# Patient Record
Sex: Female | Born: 1960
Health system: Southern US, Community
[De-identification: ages and names within clinical notes are randomized; demographics above are authoritative.]

## PROBLEM LIST (undated history)

## (undated) DIAGNOSIS — M25519 Pain in unspecified shoulder: Secondary | ICD-10-CM

## (undated) DIAGNOSIS — J019 Acute sinusitis, unspecified: Secondary | ICD-10-CM

## (undated) DIAGNOSIS — M549 Dorsalgia, unspecified: Secondary | ICD-10-CM

## (undated) DIAGNOSIS — J309 Allergic rhinitis, unspecified: Secondary | ICD-10-CM

## (undated) DIAGNOSIS — K219 Gastro-esophageal reflux disease without esophagitis: Secondary | ICD-10-CM

## (undated) DIAGNOSIS — Z5189 Encounter for other specified aftercare: Secondary | ICD-10-CM

## (undated) DIAGNOSIS — M545 Low back pain: Secondary | ICD-10-CM

## (undated) DIAGNOSIS — R42 Dizziness and giddiness: Secondary | ICD-10-CM

## (undated) DIAGNOSIS — R300 Dysuria: Secondary | ICD-10-CM

## (undated) DIAGNOSIS — J302 Other seasonal allergic rhinitis: Secondary | ICD-10-CM

## (undated) DIAGNOSIS — M1990 Unspecified osteoarthritis, unspecified site: Secondary | ICD-10-CM

## (undated) DIAGNOSIS — IMO0002 Reserved for concepts with insufficient information to code with codable children: Secondary | ICD-10-CM

## (undated) DIAGNOSIS — M25539 Pain in unspecified wrist: Secondary | ICD-10-CM

## (undated) DIAGNOSIS — T7840XA Allergy, unspecified, initial encounter: Secondary | ICD-10-CM

## (undated) DIAGNOSIS — M25569 Pain in unspecified knee: Secondary | ICD-10-CM

## (undated) HISTORY — DX: Pain in unspecified shoulder: M25.519

## (undated) HISTORY — DX: Pain in unspecified knee: M25.569

## (undated) HISTORY — DX: Dizziness and giddiness: R42

## (undated) HISTORY — DX: Acute sinusitis, unspecified: J01.90

## (undated) HISTORY — DX: Pain in unspecified wrist: M25.539

## (undated) HISTORY — DX: Encounter for other specified aftercare: Z51.89

## (undated) HISTORY — DX: Allergy, unspecified, initial encounter: T78.40XA

## (undated) HISTORY — DX: Allergic rhinitis, unspecified: J30.9

## (undated) HISTORY — DX: Dysuria: R30.0

## (undated) HISTORY — DX: Gastro-esophageal reflux disease without esophagitis: K21.9

## (undated) HISTORY — DX: Unspecified osteoarthritis, unspecified site: M19.90

## (undated) HISTORY — DX: Low back pain: M54.5

## (undated) HISTORY — DX: Reserved for concepts with insufficient information to code with codable children: IMO0002

## (undated) HISTORY — DX: Dorsalgia, unspecified: M54.9

---

## 1985-12-10 HISTORY — PX: TONSILLECTOMY: SHX5217

## 1998-05-24 ENCOUNTER — Other Ambulatory Visit: Admission: RE | Admit: 1998-05-24 | Discharge: 1998-05-24 | Payer: Self-pay | Admitting: Gynecology

## 1998-07-30 ENCOUNTER — Emergency Department (HOSPITAL_COMMUNITY): Admission: EM | Admit: 1998-07-30 | Discharge: 1998-07-30 | Payer: Self-pay | Admitting: Emergency Medicine

## 1998-07-31 ENCOUNTER — Emergency Department (HOSPITAL_COMMUNITY): Admission: EM | Admit: 1998-07-31 | Discharge: 1998-07-31 | Payer: Self-pay | Admitting: Emergency Medicine

## 2000-06-13 ENCOUNTER — Other Ambulatory Visit: Admission: RE | Admit: 2000-06-13 | Discharge: 2000-06-13 | Payer: Self-pay | Admitting: Family Medicine

## 2000-10-27 ENCOUNTER — Inpatient Hospital Stay (HOSPITAL_COMMUNITY): Admission: RE | Admit: 2000-10-27 | Discharge: 2000-10-27 | Payer: Self-pay | Admitting: Gynecology

## 2000-10-27 ENCOUNTER — Encounter: Payer: Self-pay | Admitting: Gynecology

## 2004-10-20 ENCOUNTER — Emergency Department (HOSPITAL_COMMUNITY): Admission: EM | Admit: 2004-10-20 | Discharge: 2004-10-20 | Payer: Self-pay | Admitting: Emergency Medicine

## 2004-12-10 HISTORY — PX: DILATION AND CURETTAGE OF UTERUS: SHX78

## 2004-12-15 ENCOUNTER — Ambulatory Visit (HOSPITAL_COMMUNITY): Admission: RE | Admit: 2004-12-15 | Discharge: 2004-12-15 | Payer: Self-pay | Admitting: Obstetrics and Gynecology

## 2004-12-21 ENCOUNTER — Inpatient Hospital Stay (HOSPITAL_COMMUNITY): Admission: AD | Admit: 2004-12-21 | Discharge: 2005-01-10 | Payer: Self-pay | Admitting: Obstetrics and Gynecology

## 2005-01-03 ENCOUNTER — Ambulatory Visit: Payer: Self-pay | Admitting: Neonatology

## 2005-01-08 ENCOUNTER — Encounter (INDEPENDENT_AMBULATORY_CARE_PROVIDER_SITE_OTHER): Payer: Self-pay | Admitting: *Deleted

## 2005-03-05 ENCOUNTER — Other Ambulatory Visit: Admission: RE | Admit: 2005-03-05 | Discharge: 2005-03-05 | Payer: Self-pay | Admitting: Obstetrics and Gynecology

## 2005-03-06 ENCOUNTER — Ambulatory Visit (HOSPITAL_COMMUNITY): Admission: RE | Admit: 2005-03-06 | Discharge: 2005-03-06 | Payer: Self-pay | Admitting: Obstetrics and Gynecology

## 2005-05-10 HISTORY — PX: ABDOMINAL HYSTERECTOMY: SHX81

## 2005-05-22 ENCOUNTER — Inpatient Hospital Stay (HOSPITAL_COMMUNITY): Admission: RE | Admit: 2005-05-22 | Discharge: 2005-05-24 | Payer: Self-pay | Admitting: Obstetrics and Gynecology

## 2005-05-22 ENCOUNTER — Encounter (INDEPENDENT_AMBULATORY_CARE_PROVIDER_SITE_OTHER): Payer: Self-pay | Admitting: *Deleted

## 2005-10-13 IMAGING — US US OB LIMITED
1 series · 14 of 28 positions shown · non-contrast
Comparison: none

CLINICAL DATA: 20 week 0 day assigned gestational age.  Premature rupture of membranes.  Evaluate amniotic fluid.

[Series 1: us ob limited · 0.22mm/px · 14 of 42 slices shown]
[im 2/42]
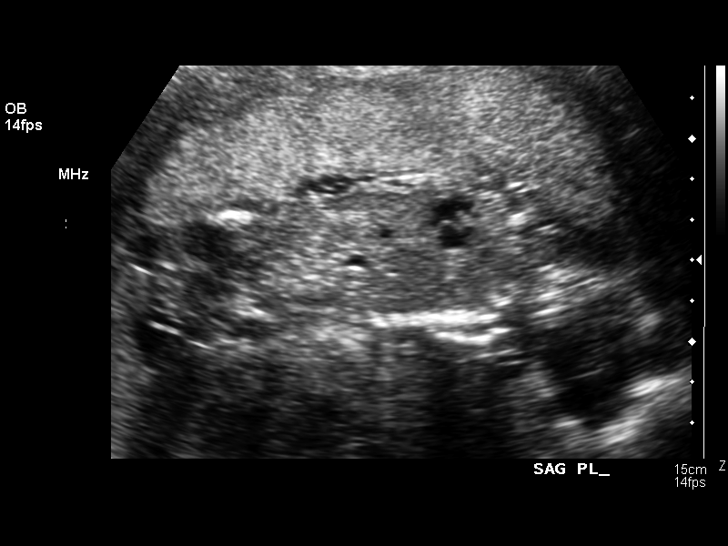
[im 5/42]
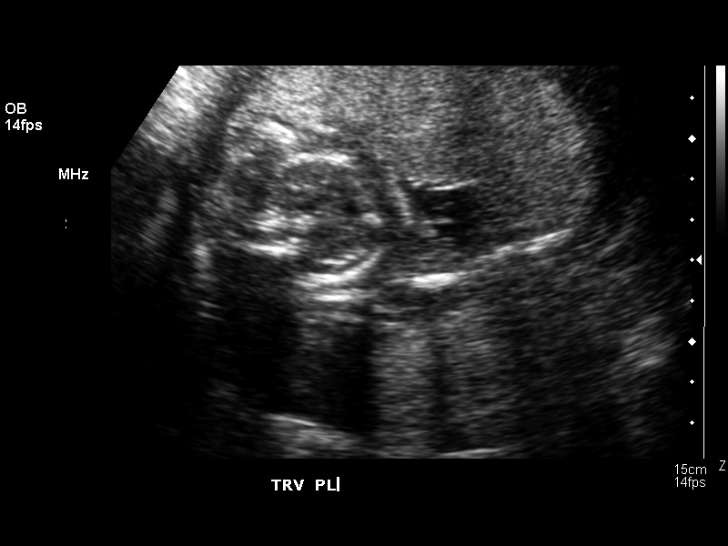
[im 8/42]
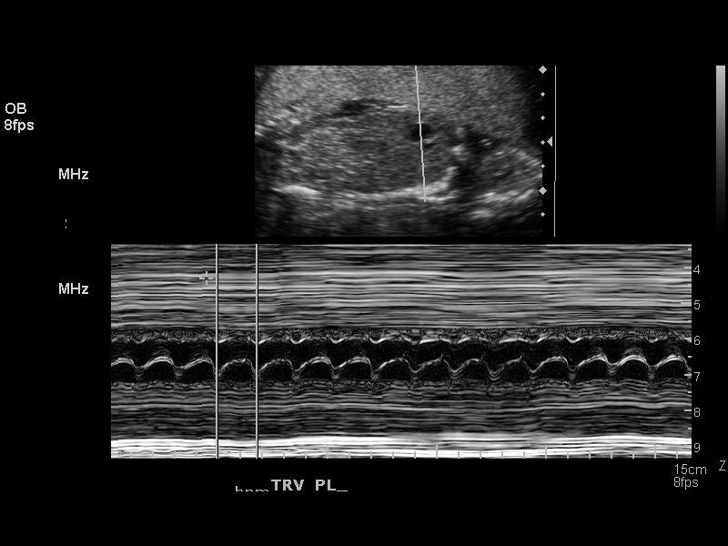
[im 11/42]
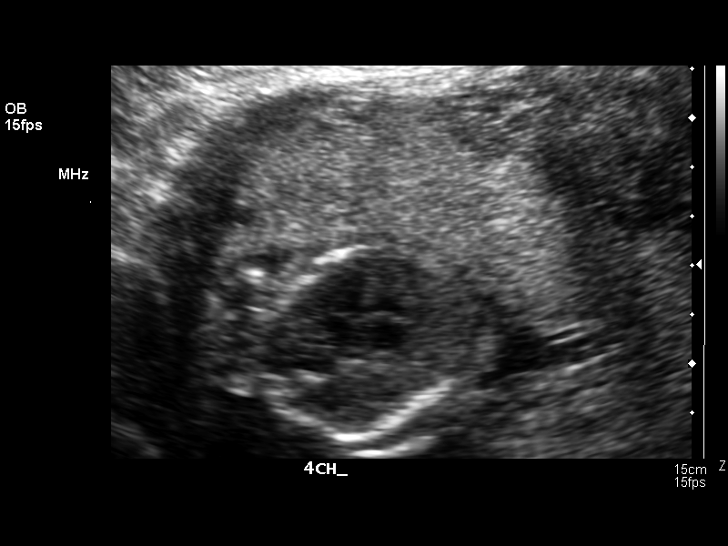
[im 14/42]
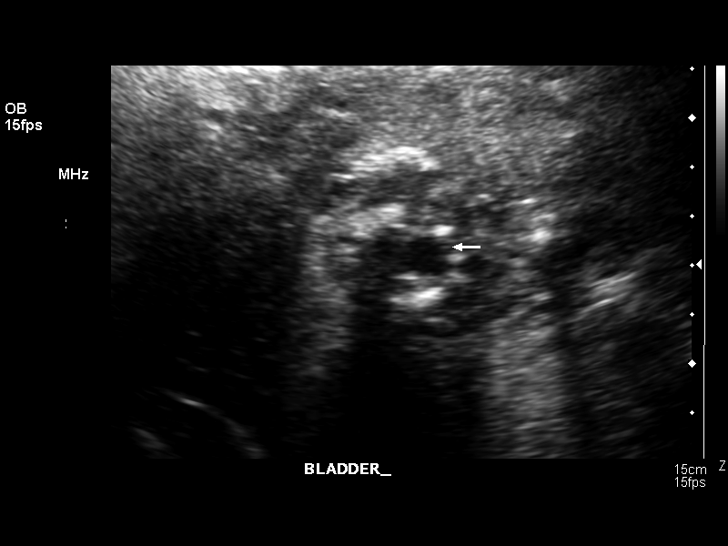
[im 17/42]
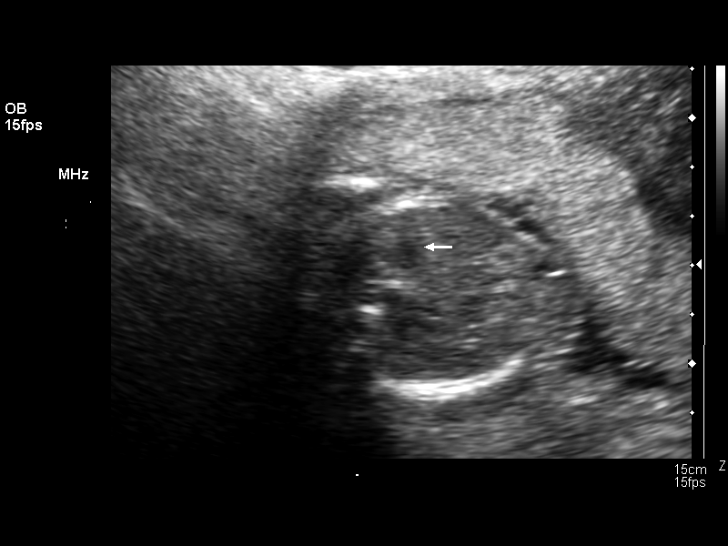
[im 20/42]
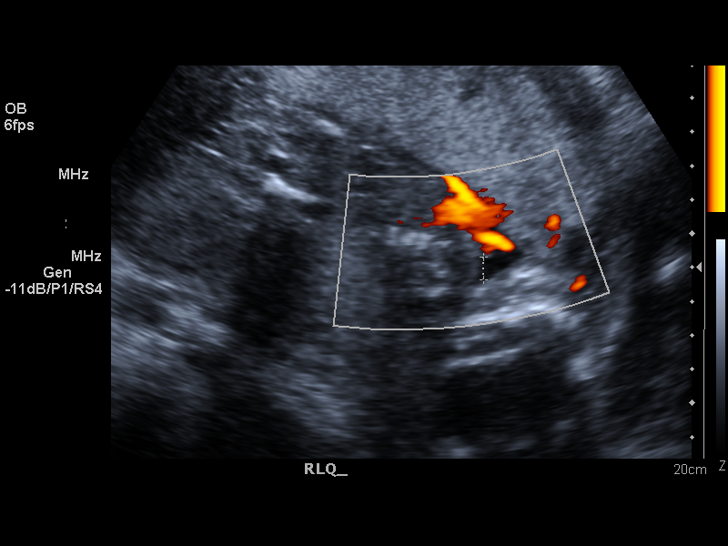
[im 23/42]
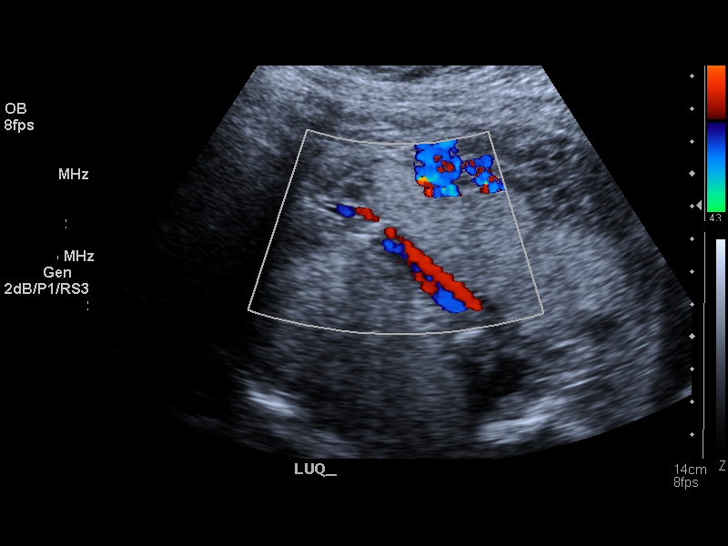
[im 26/42]
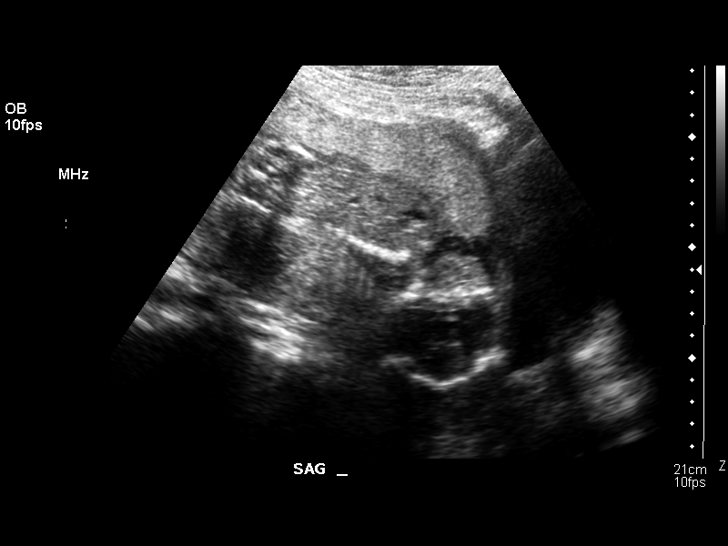
[im 29/42]
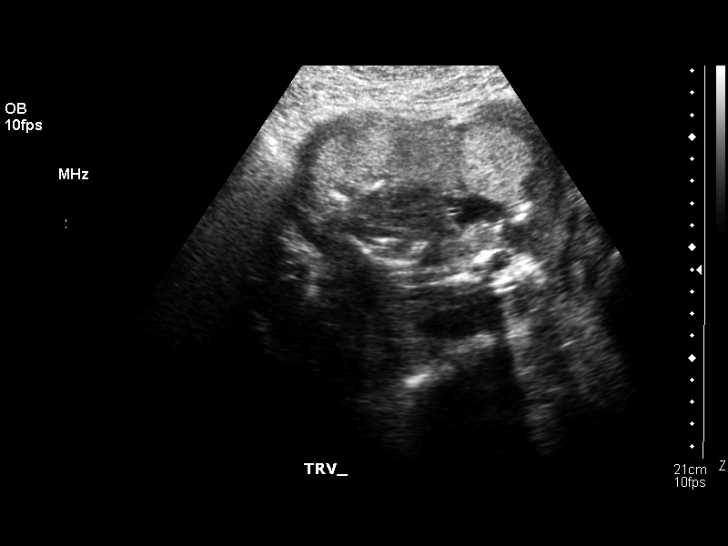
[im 32/42]
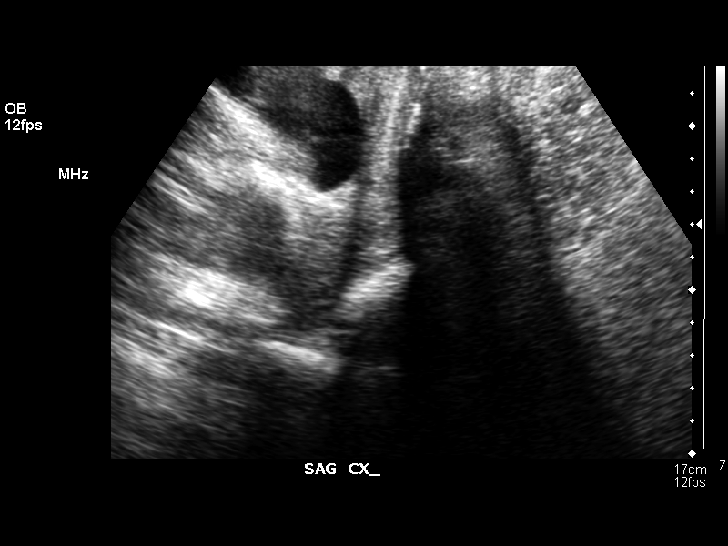
[im 35/42]
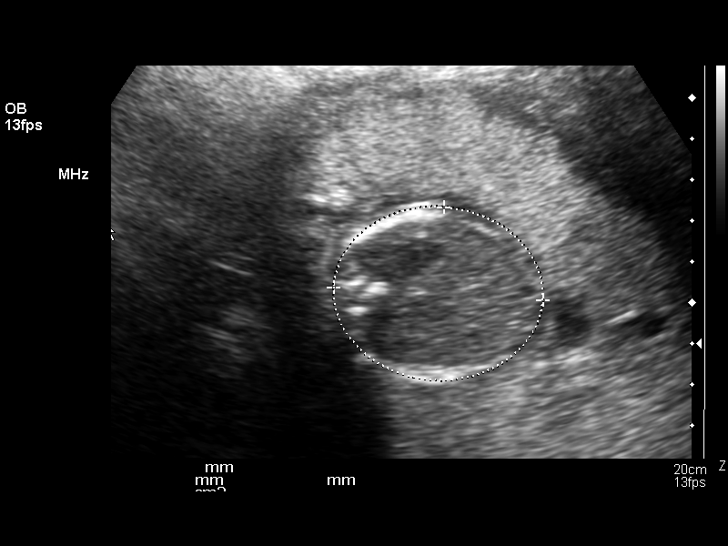
[im 38/42]
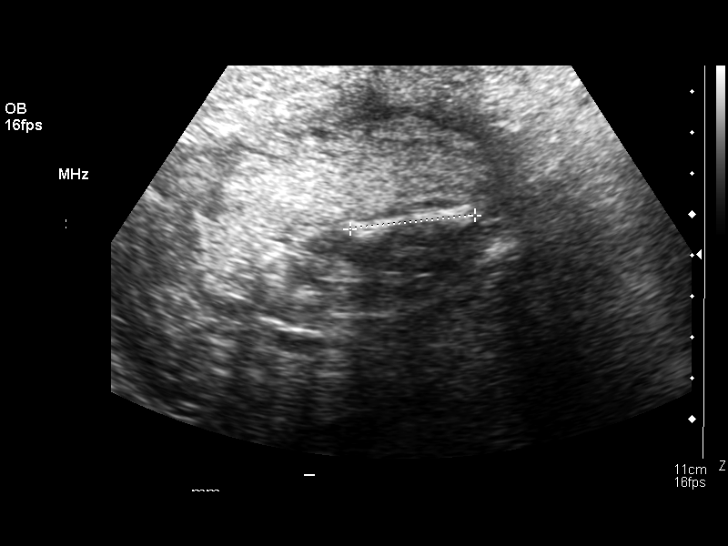
[im 42/42]
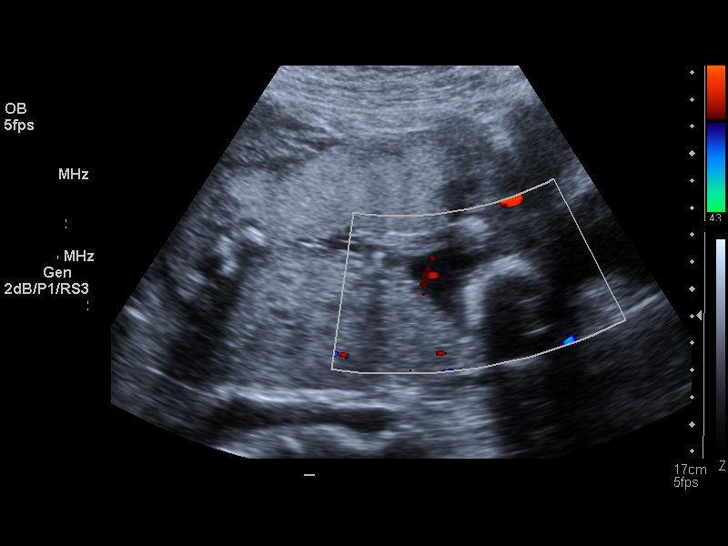

[14 of 28 positions shown; findings below may reference images not displayed]

LIMITED OBSTETRICAL ULTRASOUND:
 Number of Fetuses: 1
 Heart Rate:  163
 Movement:  Yes
 Breathing:  No
 Presentation:  Cephalic
 Placental Location:  Anterior
 Grade:  I
 Previa:  No
 Amniotic Fluid (Subjective):  Decreased  
 Amniotic Fluid (Objective):  1.9 cm Vertical pocket 

 Fetal measurements and complete anatomic evaluation were not requested.  The following fetal anatomy was visualized during this exam:  Four chamber heart, stomach, kidneys and bladder.

 MATERNAL UTERINE AND ADNEXAL FINDINGS
 Cervix:  3.3 cm Translabially
IMPRESSION: Single living intrauterine fetus in cephalic presentation.  Decreased amniotic fluid volume, consistent with patient?s history of ruptured membranes.

## 2006-04-10 ENCOUNTER — Encounter: Payer: Self-pay | Admitting: Emergency Medicine

## 2006-08-30 ENCOUNTER — Other Ambulatory Visit: Admission: RE | Admit: 2006-08-30 | Discharge: 2006-08-30 | Payer: Self-pay | Admitting: Obstetrics and Gynecology

## 2006-09-05 ENCOUNTER — Ambulatory Visit (HOSPITAL_COMMUNITY): Admission: RE | Admit: 2006-09-05 | Discharge: 2006-09-05 | Payer: Self-pay | Admitting: Obstetrics and Gynecology

## 2007-08-11 LAB — HM MAMMOGRAPHY: HM Mammogram: NORMAL

## 2008-10-18 ENCOUNTER — Ambulatory Visit: Payer: Self-pay | Admitting: Internal Medicine

## 2008-10-18 DIAGNOSIS — M25539 Pain in unspecified wrist: Secondary | ICD-10-CM | POA: Insufficient documentation

## 2008-10-18 DIAGNOSIS — M545 Low back pain, unspecified: Secondary | ICD-10-CM | POA: Insufficient documentation

## 2008-10-18 DIAGNOSIS — J309 Allergic rhinitis, unspecified: Secondary | ICD-10-CM | POA: Insufficient documentation

## 2008-10-18 DIAGNOSIS — M549 Dorsalgia, unspecified: Secondary | ICD-10-CM

## 2008-10-18 DIAGNOSIS — IMO0002 Reserved for concepts with insufficient information to code with codable children: Secondary | ICD-10-CM | POA: Insufficient documentation

## 2008-10-18 DIAGNOSIS — M25569 Pain in unspecified knee: Secondary | ICD-10-CM | POA: Insufficient documentation

## 2008-10-18 HISTORY — DX: Allergic rhinitis, unspecified: J30.9

## 2008-10-18 HISTORY — DX: Reserved for concepts with insufficient information to code with codable children: IMO0002

## 2008-10-18 HISTORY — DX: Pain in unspecified wrist: M25.539

## 2008-10-18 HISTORY — DX: Pain in unspecified knee: M25.569

## 2008-10-18 HISTORY — DX: Low back pain, unspecified: M54.50

## 2008-10-18 HISTORY — DX: Dorsalgia, unspecified: M54.9

## 2009-06-28 ENCOUNTER — Ambulatory Visit: Payer: Self-pay | Admitting: Internal Medicine

## 2009-06-28 DIAGNOSIS — IMO0002 Reserved for concepts with insufficient information to code with codable children: Secondary | ICD-10-CM

## 2009-06-28 HISTORY — DX: Reserved for concepts with insufficient information to code with codable children: IMO0002

## 2009-07-03 ENCOUNTER — Encounter: Admission: RE | Admit: 2009-07-03 | Discharge: 2009-07-03 | Payer: Self-pay | Admitting: Internal Medicine

## 2009-07-11 ENCOUNTER — Encounter: Payer: Self-pay | Admitting: Internal Medicine

## 2009-07-21 ENCOUNTER — Ambulatory Visit: Payer: Self-pay | Admitting: Internal Medicine

## 2009-08-03 ENCOUNTER — Telehealth: Payer: Self-pay | Admitting: Internal Medicine

## 2009-08-04 ENCOUNTER — Ambulatory Visit: Payer: Self-pay | Admitting: Internal Medicine

## 2009-08-04 HISTORY — PX: COLONOSCOPY: SHX174

## 2009-08-04 LAB — HM COLONOSCOPY

## 2009-10-30 ENCOUNTER — Emergency Department (HOSPITAL_COMMUNITY): Admission: EM | Admit: 2009-10-30 | Discharge: 2009-10-31 | Payer: Self-pay | Admitting: Emergency Medicine

## 2009-11-01 ENCOUNTER — Ambulatory Visit: Payer: Self-pay | Admitting: Internal Medicine

## 2009-11-01 DIAGNOSIS — R42 Dizziness and giddiness: Secondary | ICD-10-CM | POA: Insufficient documentation

## 2009-11-01 HISTORY — DX: Dizziness and giddiness: R42

## 2009-11-01 LAB — CONVERTED CEMR LAB
Cholesterol: 152 mg/dL (ref 0–200)
HDL: 58.6 mg/dL (ref >39.00)
LDL Cholesterol: 78 mg/dL (ref 0–99)
TSH: 0.9 microintl units/mL (ref 0.35–5.50)
Total CHOL/HDL Ratio: 3
Triglycerides: 76 mg/dL (ref 0.0–149.0)
VLDL: 15.2 mg/dL (ref 0.0–40.0)

## 2009-12-21 ENCOUNTER — Telehealth: Payer: Self-pay | Admitting: Internal Medicine

## 2009-12-22 ENCOUNTER — Ambulatory Visit: Payer: Self-pay | Admitting: Internal Medicine

## 2009-12-22 DIAGNOSIS — J019 Acute sinusitis, unspecified: Secondary | ICD-10-CM

## 2009-12-22 HISTORY — DX: Acute sinusitis, unspecified: J01.90

## 2009-12-24 LAB — CONVERTED CEMR LAB
HCV Ab: NEGATIVE
Hep A IgM: NEGATIVE
Hep B C IgM: NEGATIVE
Hepatitis B Surface Ag: NEGATIVE

## 2010-01-06 ENCOUNTER — Telehealth: Payer: Self-pay | Admitting: Internal Medicine

## 2010-01-09 ENCOUNTER — Telehealth: Payer: Self-pay | Admitting: Internal Medicine

## 2010-06-28 ENCOUNTER — Ambulatory Visit: Payer: Self-pay | Admitting: Internal Medicine

## 2010-10-13 ENCOUNTER — Ambulatory Visit: Payer: Self-pay | Admitting: Internal Medicine

## 2010-10-13 LAB — CONVERTED CEMR LAB
ALT: 14 units/L (ref 0–35)
AST: 18 units/L (ref 0–37)
Albumin: 3.8 g/dL (ref 3.5–5.2)
Alkaline Phosphatase: 75 units/L (ref 39–117)
BUN: 9 mg/dL (ref 6–23)
Basophils Absolute: 0 10*3/uL (ref 0.0–0.1)
Basophils Relative: 0.4 % (ref 0.0–3.0)
Bilirubin Urine: NEGATIVE
Bilirubin, Direct: 0.1 mg/dL (ref 0.0–0.3)
CO2: 29 meq/L (ref 19–32)
Calcium: 9.2 mg/dL (ref 8.4–10.5)
Chloride: 106 meq/L (ref 96–112)
Cholesterol: 147 mg/dL (ref 0–200)
Creatinine, Ser: 0.8 mg/dL (ref 0.4–1.2)
Eosinophils Absolute: 0.1 10*3/uL (ref 0.0–0.7)
Eosinophils Relative: 2.6 % (ref 0.0–5.0)
GFR calc non Af Amer: 98.86 mL/min (ref >60)
Glucose, Bld: 94 mg/dL (ref 70–99)
HCT: 35.8 % — ABNORMAL LOW (ref 36.0–46.0)
HDL: 51.1 mg/dL (ref >39.00)
Hemoglobin: 12.2 g/dL (ref 12.0–15.0)
Ketones, ur: NEGATIVE mg/dL
LDL Cholesterol: 88 mg/dL (ref 0–99)
Leukocytes, UA: NEGATIVE
Lymphocytes Relative: 28.4 % (ref 12.0–46.0)
Lymphs Abs: 1.6 10*3/uL (ref 0.7–4.0)
MCHC: 34.1 g/dL (ref 30.0–36.0)
MCV: 92 fL (ref 78.0–100.0)
Monocytes Absolute: 0.4 10*3/uL (ref 0.1–1.0)
Monocytes Relative: 7.8 % (ref 3.0–12.0)
Neutro Abs: 3.4 10*3/uL (ref 1.4–7.7)
Neutrophils Relative %: 60.8 % (ref 43.0–77.0)
Nitrite: NEGATIVE
Platelets: 243 10*3/uL (ref 150.0–400.0)
Potassium: 4.3 meq/L (ref 3.5–5.1)
RBC: 3.89 M/uL (ref 3.87–5.11)
RDW: 13.2 % (ref 11.5–14.6)
Sodium: 140 meq/L (ref 135–145)
Specific Gravity, Urine: 1.015 (ref 1.000–1.030)
TSH: 1.79 microintl units/mL (ref 0.35–5.50)
Total Bilirubin: 0.4 mg/dL (ref 0.3–1.2)
Total CHOL/HDL Ratio: 3
Total Protein, Urine: NEGATIVE mg/dL
Total Protein: 7 g/dL (ref 6.0–8.3)
Triglycerides: 38 mg/dL (ref 0.0–149.0)
Urine Glucose: NEGATIVE mg/dL
Urobilinogen, UA: 0.2 (ref 0.0–1.0)
VLDL: 7.6 mg/dL (ref 0.0–40.0)
WBC: 5.6 10*3/uL (ref 4.5–10.5)
pH: 6.5 (ref 5.0–8.0)

## 2010-10-18 ENCOUNTER — Ambulatory Visit: Payer: Self-pay | Admitting: Internal Medicine

## 2010-10-18 ENCOUNTER — Encounter: Payer: Self-pay | Admitting: Internal Medicine

## 2010-10-18 DIAGNOSIS — M25519 Pain in unspecified shoulder: Secondary | ICD-10-CM

## 2010-10-18 DIAGNOSIS — R300 Dysuria: Secondary | ICD-10-CM | POA: Insufficient documentation

## 2010-10-18 HISTORY — DX: Pain in unspecified shoulder: M25.519

## 2010-10-18 HISTORY — DX: Dysuria: R30.0

## 2010-10-23 ENCOUNTER — Encounter: Payer: Self-pay | Admitting: Internal Medicine

## 2010-11-10 ENCOUNTER — Telehealth: Payer: Self-pay | Admitting: Internal Medicine

## 2010-12-27 ENCOUNTER — Telehealth: Payer: Self-pay | Admitting: Internal Medicine

## 2010-12-28 ENCOUNTER — Ambulatory Visit
Admission: RE | Admit: 2010-12-28 | Discharge: 2010-12-28 | Payer: Self-pay | Source: Home / Self Care | Attending: Internal Medicine | Admitting: Internal Medicine

## 2010-12-30 ENCOUNTER — Encounter: Payer: Self-pay | Admitting: Obstetrics and Gynecology

## 2011-01-04 ENCOUNTER — Other Ambulatory Visit: Payer: Self-pay | Admitting: Internal Medicine

## 2011-01-04 DIAGNOSIS — J329 Chronic sinusitis, unspecified: Secondary | ICD-10-CM

## 2011-01-07 LAB — CONVERTED CEMR LAB
ALT: 18 units/L (ref 0–35)
AST: 18 units/L (ref 0–37)
Albumin: 3.9 g/dL (ref 3.5–5.2)
Alkaline Phosphatase: 63 units/L (ref 39–117)
BUN: 9 mg/dL (ref 6–23)
Basophils Absolute: 0 10*3/uL (ref 0.0–0.1)
Basophils Relative: 1 % (ref 0.0–3.0)
Bilirubin Urine: NEGATIVE
Bilirubin, Direct: 0.1 mg/dL (ref 0.0–0.3)
CA 125: 4.5 units/mL (ref 0.0–30.2)
CO2: 32 meq/L (ref 19–32)
Calcium: 9.4 mg/dL (ref 8.4–10.5)
Chloride: 104 meq/L (ref 96–112)
Cholesterol: 145 mg/dL (ref 0–200)
Creatinine, Ser: 0.7 mg/dL (ref 0.4–1.2)
Eosinophils Absolute: 0.1 10*3/uL (ref 0.0–0.7)
Eosinophils Relative: 2.6 % (ref 0.0–5.0)
GFR calc Af Amer: 115 mL/min
GFR calc non Af Amer: 95 mL/min
Glucose, Bld: 99 mg/dL (ref 70–99)
HCT: 37 % (ref 36.0–46.0)
HDL: 51.7 mg/dL (ref >39.0)
Hemoglobin, Urine: NEGATIVE
Hemoglobin: 12.3 g/dL (ref 12.0–15.0)
Ketones, ur: NEGATIVE mg/dL
LDL Cholesterol: 82 mg/dL (ref 0–99)
Leukocytes, UA: NEGATIVE
Lymphocytes Relative: 31.7 % (ref 12.0–46.0)
MCHC: 33.4 g/dL (ref 30.0–36.0)
MCV: 91.8 fL (ref 78.0–100.0)
Monocytes Absolute: 0.4 10*3/uL (ref 0.1–1.0)
Monocytes Relative: 8 % (ref 3.0–12.0)
Neutro Abs: 2.6 10*3/uL (ref 1.4–7.7)
Neutrophils Relative %: 56.7 % (ref 43.0–77.0)
Nitrite: NEGATIVE
Pap Smear: NORMAL
Platelets: 263 10*3/uL (ref 150–400)
Potassium: 4.3 meq/L (ref 3.5–5.1)
RBC: 4.02 M/uL (ref 3.87–5.11)
RDW: 12.4 % (ref 11.5–14.6)
Sodium: 142 meq/L (ref 135–145)
Specific Gravity, Urine: 1.01 (ref 1.000–1.03)
TSH: 1.09 microintl units/mL (ref 0.35–5.50)
Total Bilirubin: 0.7 mg/dL (ref 0.3–1.2)
Total CHOL/HDL Ratio: 2.8
Total Protein, Urine: NEGATIVE mg/dL
Total Protein: 7.4 g/dL (ref 6.0–8.3)
Triglycerides: 57 mg/dL (ref 0–149)
Urine Glucose: NEGATIVE mg/dL
Urobilinogen, UA: 0.2 (ref 0.0–1.0)
VLDL: 11 mg/dL (ref 0–40)
WBC: 4.5 10*3/uL (ref 4.5–10.5)
pH: 7 (ref 5.0–8.0)

## 2011-01-09 NOTE — Consult Note (Signed)
Summary: [facility]  [facility]   Imported By: Lester [place] 10/27/2010 11:03:31  _____________________________________________________________________  External Attachment:    Type:   Image     Comment:   External Document
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
## 2011-01-09 NOTE — Progress Notes (Signed)
  Phone Note Refill Request  on January 06, 2010 10:49 AM  Refills Requested: Medication #1:  MECLIZINE HCL 12.5 MG TABS 1 - 2 by mouth q 6 hrs as needed dizzy   Dosage confirmed as above?Dosage Confirmed   Notes: [facility] Initial call taken by: Scharlene Gloss,  January 06, 2010 10:49 AM    Prescriptions: FLUTICASONE PROPIONATE 50 MCG/ACT SUSP (FLUTICASONE PROPIONATE) 1 spray/side once daily  #1 x 11   Entered by:   Scharlene Gloss   Authorized by:   Corwin Levins MD   Signed by:   Scharlene Gloss on 01/06/2010   Method used:   Faxed to ...       [facility] Delivery Fax) (mail-order)             ,          Ph: [phone]       Fax: [phone]   RxID:   [id number]

## 2011-01-09 NOTE — Progress Notes (Signed)
Summary: RX req  Phone Note Call from Patient   Caller: Patient [phone] Summary of Call: Pt called stating she recently completed a ABX course and now has a yeast infection. Pt is requesting Rx to [facility] to [facility] Initial call taken by: Margaret Pyle, CMA,  November 10, 2010 1:10 PM  Follow-up for Phone Call        done per emr Follow-up by: Corwin Levins MD,  November 10, 2010 2:06 PM  Additional Follow-up for Phone Call Additional follow up Details #1::        Pt advised via VM Additional Follow-up by: Margaret Pyle, CMA,  November 10, 2010 2:38 PM    New/Updated Medications: FLUCONAZOLE 150 MG TABS (FLUCONAZOLE) 1po q 3 days as needed Prescriptions: FLUCONAZOLE 150 MG TABS (FLUCONAZOLE) 1po q 3 days as needed  #2 x 1   Entered and Authorized by:   Corwin Levins MD   Signed by:   Corwin Levins MD on 11/10/2010   Method used:   [facility]ronically to        [facility] # [phone]* (retail)       [address] [place], [place]  [address]       Ph: [phone]       Fax: [phone]   RxID:   [id number]

## 2011-01-09 NOTE — Letter (Signed)
Summary: [facility]   Imported By: Sherian Rein 07/21/2009 13:30:59  _____________________________________________________________________  External Attachment:    Type:   Image     Comment:   External Document

## 2011-01-09 NOTE — Progress Notes (Signed)
Summary: Yeast  Phone Note Call from Patient   Caller: Patient [phone] Summary of Call: pt called stating that she has a yeast inf from ABX for sinus inf. pt is requesting Rx Initial call taken by: Margaret Pyle, CMA,  January 09, 2010 10:45 AM  Follow-up for Phone Call        done escript Follow-up by: Corwin Levins MD,  January 09, 2010 12:51 PM  Additional Follow-up for Phone Call Additional follow up Details #1::        pt informed via VM Additional Follow-up by: Margaret Pyle, CMA,  January 09, 2010 1:15 PM    New/Updated Medications: FLUCONAZOLE 150 MG TABS (FLUCONAZOLE) 1 by mouth every other day as needed Prescriptions: FLUCONAZOLE 150 MG TABS (FLUCONAZOLE) 1 by mouth every other day as needed  #2 x 1   Entered and Authorized by:   Corwin Levins MD   Signed by:   Corwin Levins MD on 01/09/2010   Method used:   Electronically to        [facility] # [phone]* (retail)       [address] [place], [place]  [address]       Ph: [phone]       Fax: [phone]   RxID:   [id number]

## 2011-01-09 NOTE — Progress Notes (Signed)
Summary: ? re procedure  Phone Note Call from Patient Call back at Home Phone [phone]   Caller: Patient Call For: perry Reason for Call: Talk to Nurse Summary of Call: Patient wants to go over instructions with nurse, having procedure tomorrow Initial call taken by: Tawni Levy,  August 03, 2009 8:45 AM  Follow-up for Phone Call        Pt having colonoscopy tomorrow, using Moviprep.  Pt wanted to review instructions; instructed, per sheet as to when to drink prep today and when to start tomorrow.  Pt voiced understanding and has no further questions Follow-up by: Karl Bales RN,  August 03, 2009 8:57 AM

## 2011-01-09 NOTE — Assessment & Plan Note (Signed)
Summary: SINUS/NWS   Vital Signs:  Patient profile:   50 year old female Height:      64 inches Weight:      226 pounds BMI:     38.93 O2 Sat:      97 % on Room air Temp:     98.5 degrees F oral Pulse rate:   85 / minute BP sitting:   116 / 72  (left arm) Cuff size:   regular  Vitals Entered ByZella Ball Ewing (December 22, 2009 10:19 AM)  O2 Flow:  Room air CC: sinus congestion/RE   CC:  sinus congestion/RE.  History of Present Illness: here with 3 days onset facial pain, pressure, fever and greenish. bloody d/c without ST , and Pt denies CP, sob, doe, wheezing, orthopnea, pnd, worsening LE edema, palps, dizziness or syncope   Also mentions her husband has recetnly been diagnosed with Hep C and she would like to be tested.  Pt denies new neuro symptoms such as headache, facial or extremity weakness   No GI symptoms as well or chronic liver s/s.    Problems Prior to Update: 1)  Sinusitis- Acute-nos  (ICD-461.9) 2)  Contact or Exposure To Other Viral Diseases  (ICD-V01.79) 3)  Intermittent Vertigo  (ICD-780.4) 4)  Preventive Health Care  (ICD-V70.0) 5)  Lumbar Radiculopathy, Right  (ICD-724.4) 6)  Neoplasm, Malignant, Colon, Family Hx, Father  (ICD-V16.0) 7)  Back Pain, Upper  (ICD-724.5) 8)  Unspecified Neuralgia Neuritis and Radiculitis  (ICD-729.2) 9)  Low Back Pain  (ICD-724.2) 10)  Knee Pain, Right  (ICD-719.46) 11)  Wrist Pain, Right  (ICD-719.43) 12)  Preventive Health Care  (ICD-V70.0) 13)  Allergic Rhinitis  (ICD-477.9)  Medications Prior to Update: 1)  Tylenol Pm Extra Strength 500-25 Mg Tabs (Diphenhydramine-Apap (Sleep)) .... Prn 2)  Meloxicam 15 Mg Tabs (Meloxicam) .Marland Kitchen.. 1 By Mouth Once Daily As Needed Pain 3)  Cetirizine Hcl 10 Mg Tabs (Cetirizine Hcl) .Marland Kitchen.. 1 By Mouth Once Daily As Needed Allergies 4)  Meclizine Hcl 12.5 Mg Tabs (Meclizine Hcl) .Marland Kitchen.. 1 - 2 By Mouth Q 6 Hrs As Needed Dizzy 5)  Fluticasone Propionate 50 Mcg/act Susp (Fluticasone Propionate) .... 2  Spray/side Once Daily  Current Medications (verified): 1)  Tylenol Pm Extra Strength 500-25 Mg Tabs (Diphenhydramine-Apap (Sleep)) .... Prn 2)  Meloxicam 15 Mg Tabs (Meloxicam) .Marland Kitchen.. 1 By Mouth Once Daily As Needed Pain 3)  Cetirizine Hcl 10 Mg Tabs (Cetirizine Hcl) .Marland Kitchen.. 1 By Mouth Once Daily As Needed Allergies 4)  Meclizine Hcl 12.5 Mg Tabs (Meclizine Hcl) .Marland Kitchen.. 1 - 2 By Mouth Q 6 Hrs As Needed Dizzy 5)  Fluticasone Propionate 50 Mcg/act Susp (Fluticasone Propionate) .Marland Kitchen.. 1 Spray/side Once Daily 6)  Clarithromycin 500 Mg Tabs (Clarithromycin) .Marland Kitchen.. 1 By Mouth Two Times A Day  Allergies (verified): 1)  ! Bactrim Ds (Sulfamethoxazole-Trimethoprim)  Past History:  Past Medical History: Last updated: 10/18/2008 Allergic rhinitis - perennial Low back pain - recurrent right sciatica  Past Surgical History: Last updated: 10/18/2008 Tonsillectomy Hysterectomy - 6/06 - due to fibroids, stillborn baby; ovaries intact  Social History: Last updated: 10/18/2008 Married 1 living child/1 miscarriage/1 stillborn work - Print production planner for [employer] Never Smoked Alcohol use-no  Risk Factors: Smoking Status: never (10/18/2008)  Review of Systems       all otherwise negative per pt  Physical Exam  General:  alert and overweight-appearing.  , mild ill  Head:  normocephalic and atraumatic.   Eyes:  vision grossly intact,  pupils equal, and pupils round.   Ears:  bilat tm's red, sinus nontender Nose:  nasal dischargemucosal pallor and mucosal erythema.   Mouth:  pharyngeal erythema and fair dentition.   Neck:  supple and no masses.   Lungs:  normal respiratory effort and normal breath sounds.   Heart:  normal rate and regular rhythm.   Extremities:  no edema, no erythema    Impression & Recommendations:  Problem # 1:  SINUSITIS- ACUTE-NOS (ICD-461.9)  Her updated medication list for this problem includes:    Fluticasone Propionate 50 Mcg/act Susp (Fluticasone propionate) .Marland Kitchen... 1  spray/side once daily    Clarithromycin 500 Mg Tabs (Clarithromycin) .Marland Kitchen... 1 by mouth two times a day treat as above, f/u any worsening signs or symptoms   Problem # 2:  CONTACT OR EXPOSURE TO OTHER VIRAL DISEASES (ICD-V01.79)  to check hepatitis acute panel with hx of exposure to hep c  Orders: T-Hepatitis Acute Panel (04540-98119)  Problem # 3:  ALLERGIC RHINITIS (ICD-477.9)  Her updated medication list for this problem includes:    Cetirizine Hcl 10 Mg Tabs (Cetirizine hcl) .Marland Kitchen... 1 by mouth once daily as needed allergies    Fluticasone Propionate 50 Mcg/act Susp (Fluticasone propionate) .Marland Kitchen... 1 spray/side once daily treat as above, f/u any worsening signs or symptoms   Complete Medication List: 1)  Tylenol Pm Extra Strength 500-25 Mg Tabs (Diphenhydramine-apap (sleep)) .... Prn 2)  Meloxicam 15 Mg Tabs (Meloxicam) .Marland Kitchen.. 1 by mouth once daily as needed pain 3)  Cetirizine Hcl 10 Mg Tabs (Cetirizine hcl) .Marland Kitchen.. 1 by mouth once daily as needed allergies 4)  Meclizine Hcl 12.5 Mg Tabs (Meclizine hcl) .Marland Kitchen.. 1 - 2 by mouth q 6 hrs as needed dizzy 5)  Fluticasone Propionate 50 Mcg/act Susp (Fluticasone propionate) .Marland Kitchen.. 1 spray/side once daily 6)  Clarithromycin 500 Mg Tabs (Clarithromycin) .Marland Kitchen.. 1 by mouth two times a day  Patient Instructions: 1)  Please take all new medications as prescribed - the antibiotic 2)  Continue all previous medications as before this visit , except decrease the flonase to 1 spray each side per day 3)  call if you need the flonase changed to nasacort AQ if the flonase is still too drying 4)  Please go to the Lab in the basement for your blood and/or urine tests today 5)  You can also use Mucinex OTC or it's generic for congestion  6)  Please schedule a follow-up appointment in November 2011 for the yearly exam, or sooner if needed Prescriptions: CLARITHROMYCIN 500 MG TABS (CLARITHROMYCIN) 1 by mouth two times a day  #20 x 0   Entered and Authorized by:   Corwin Levins MD   Signed by:   Corwin Levins MD on 12/22/2009   Method used:   Print then Give to Patient   RxID:   [id number]

## 2011-01-09 NOTE — Progress Notes (Signed)
Summary: RT ear pain  Phone Note Call from Patient   Caller: Patient [phone] (c) Summary of Call: pt called stating that she is still having severe sinus congestion and RT ear pain. pt is requesting an ABX. I advised pt that office policy states that we cannot Rx ABX without OV but I told pt that I would ask MD since she was since recently for the same issue. please advise Initial call taken by: Margaret Pyle, CMA,  December 21, 2009 4:22 PM  Follow-up for Phone Call        needs OV Follow-up by: Corwin Levins MD,  December 21, 2009 5:07 PM  Additional Follow-up for Phone Call Additional follow up Details #1::        pt informed, will call back to schedule appt Additional Follow-up by: Margaret Pyle, CMA,  December 22, 2009 8:04 AM

## 2011-01-09 NOTE — Miscellaneous (Signed)
Summary: previsit  Clinical Lists Changes  Medications: Added new medication of MOVIPREP 100 GM  SOLR (PEG-KCL-NACL-NASULF-NA ASC-C) As directed - Signed Rx of MOVIPREP 100 GM  SOLR (PEG-KCL-NACL-NASULF-NA ASC-C) As directed;  #1 x 0;  Signed;  Entered by: Clide Cliff RN;  Authorized by: Hilarie Fredrickson MD;  Method used: Electronically to [facility] # [phone]*, [address] [place], [place], [place]  [address], Ph: [phone], Fax: [phone] Allergies: Changed allergy or adverse reaction from BACTRIM DS (SULFAMETHOXAZOLE-TRIMETHOPRIM) to BACTRIM DS (SULFAMETHOXAZOLE-TRIMETHOPRIM)    Prescriptions: MOVIPREP 100 GM  SOLR (PEG-KCL-NACL-NASULF-NA ASC-C) As directed  #1 x 0   Entered by:   Clide Cliff RN   Authorized by:   Hilarie Fredrickson MD   Signed by:   Clide Cliff RN on 07/21/2009   Method used:   Electronically to        [facility] # [phone]* (retail)       [address] [place], [place]  [address]       Ph: [phone]       Fax: [phone]   RxID:   [id number]

## 2011-01-09 NOTE — Assessment & Plan Note (Signed)
Summary: ?SINUS INFECTION-LB   Vital Signs:  Patient profile:   50 year old female Height:      64 inches Weight:      233.50 pounds BMI:     40.23 O2 Sat:      98 % on Room air Temp:     99.4 degrees F oral Pulse rate:   79 / minute BP sitting:   110 / 80  (left arm) Cuff size:   large  Vitals Entered By: Zella Ball Ewing CMA Duncan Dull) (June 28, 2010 4:13 PM)  O2 Flow:  Room air CC: Sinus Infection/RE   CC:  Sinus Infection/RE.  History of Present Illness: here with 3 days onset facial pain, pressure, fever, and greenish d/c, with mild ST and nonprod cough;  Pt denies CP, sob, doe, wheezing, orthopnea, pnd, worsening LE edema, palps, dizziness or syncope   Pt denies new neuro symptoms such as headache, facial or extremity weakness   , Husband ill with simlar last wk, now improved with antibx  Problems Prior to Update: 1)  Sinusitis- Acute-nos  (ICD-461.9) 2)  Sinusitis- Acute-nos  (ICD-461.9) 3)  Contact or Exposure To Other Viral Diseases  (ICD-V01.79) 4)  Intermittent Vertigo  (ICD-780.4) 5)  Preventive Health Care  (ICD-V70.0) 6)  Lumbar Radiculopathy, Right  (ICD-724.4) 7)  Neoplasm, Malignant, Colon, Family Hx, Father  (ICD-V16.0) 8)  Back Pain, Upper  (ICD-724.5) 9)  Unspecified Neuralgia Neuritis and Radiculitis  (ICD-729.2) 10)  Low Back Pain  (ICD-724.2) 11)  Knee Pain, Right  (ICD-719.46) 12)  Wrist Pain, Right  (ICD-719.43) 13)  Preventive Health Care  (ICD-V70.0) 14)  Allergic Rhinitis  (ICD-477.9)  Medications Prior to Update: 1)  Tylenol Pm Extra Strength 500-25 Mg Tabs (Diphenhydramine-Apap (Sleep)) .... Prn 2)  Meloxicam 15 Mg Tabs (Meloxicam) .Marland Kitchen.. 1 By Mouth Once Daily As Needed Pain 3)  Cetirizine Hcl 10 Mg Tabs (Cetirizine Hcl) .Marland Kitchen.. 1 By Mouth Once Daily As Needed Allergies 4)  Meclizine Hcl 12.5 Mg Tabs (Meclizine Hcl) .Marland Kitchen.. 1 - 2 By Mouth Q 6 Hrs As Needed Dizzy 5)  Fluticasone Propionate 50 Mcg/act Susp (Fluticasone Propionate) .Marland Kitchen.. 1 Spray/side Once  Daily 6)  Clarithromycin 500 Mg Tabs (Clarithromycin) .Marland Kitchen.. 1 By Mouth Two Times A Day 7)  Fluconazole 150 Mg Tabs (Fluconazole) .Marland Kitchen.. 1 By Mouth Every Other Day As Needed  Current Medications (verified): 1)  Tylenol Pm Extra Strength 500-25 Mg Tabs (Diphenhydramine-Apap (Sleep)) .... Prn 2)  Meloxicam 15 Mg Tabs (Meloxicam) .Marland Kitchen.. 1 By Mouth Once Daily As Needed Pain 3)  Cetirizine Hcl 10 Mg Tabs (Cetirizine Hcl) .Marland Kitchen.. 1 By Mouth Once Daily As Needed Allergies 4)  Meclizine Hcl 12.5 Mg Tabs (Meclizine Hcl) .Marland Kitchen.. 1 - 2 By Mouth Q 6 Hrs As Needed Dizzy 5)  Fluticasone Propionate 50 Mcg/act Susp (Fluticasone Propionate) .Marland Kitchen.. 1 Spray/side Once Daily 6)  Cephalexin 500 Mg Caps (Cephalexin) .Marland Kitchen.. 1po Three Times A Day 7)  Fluconazole 150 Mg Tabs (Fluconazole) .Marland Kitchen.. 1 By Mouth Every Other Day As Needed 8)  Hydrocodone-Homatropine 5-1.5 Mg/50ml Syrp (Hydrocodone-Homatropine) .Marland Kitchen.. 1 Tsp By Mouth Q 6 Hrs As Needed Cough  Allergies (verified): 1)  ! Bactrim Ds (Sulfamethoxazole-Trimethoprim)  Past History:  Past Medical History: Last updated: 10/18/2008 Allergic rhinitis - perennial Low back pain - recurrent right sciatica  Past Surgical History: Last updated: 10/18/2008 Tonsillectomy Hysterectomy - 6/06 - due to fibroids, stillborn baby; ovaries intact  Social History: Last updated: 10/18/2008 Married 1 living child/1 miscarriage/1 stillborn work - Print production planner  for [employer] Never Smoked Alcohol use-no  Risk Factors: Smoking Status: never (10/18/2008)  Review of Systems       all otherwise negative per pt -    Physical Exam  General:  alert and overweight-appearing.  , mild ill  Head:  normocephalic and atraumatic.   Eyes:  vision grossly intact, pupils equal, and pupils round.   Ears:  bilat tm's red, sinus tender bilat Nose:  nasal dischargemucosal pallor and mucosal edema.   Mouth:  pharyngeal erythema and fair dentition.   Neck:  supple and cervical lymphadenopathy.   Lungs:   normal respiratory effort and normal breath sounds.   Heart:  normal rate and regular rhythm.   Extremities:  no edema, no erythema    Impression & Recommendations:  Problem # 1:  SINUSITIS- ACUTE-NOS (ICD-461.9)  Her updated medication list for this problem includes:    Fluticasone Propionate 50 Mcg/act Susp (Fluticasone propionate) .Marland Kitchen... 1 spray/side once daily    Cephalexin 500 Mg Caps (Cephalexin) .Marland Kitchen... 1po three times a day    Hydrocodone-homatropine 5-1.5 Mg/91ml Syrp (Hydrocodone-homatropine) .Marland Kitchen... 1 tsp by mouth q 6 hrs as needed cough treat as above, f/u any worsening signs or symptoms , has significant cough as well  Complete Medication List: 1)  Tylenol Pm Extra Strength 500-25 Mg Tabs (Diphenhydramine-apap (sleep)) .... Prn 2)  Meloxicam 15 Mg Tabs (Meloxicam) .Marland Kitchen.. 1 by mouth once daily as needed pain 3)  Cetirizine Hcl 10 Mg Tabs (Cetirizine hcl) .Marland Kitchen.. 1 by mouth once daily as needed allergies 4)  Meclizine Hcl 12.5 Mg Tabs (Meclizine hcl) .Marland Kitchen.. 1 - 2 by mouth q 6 hrs as needed dizzy 5)  Fluticasone Propionate 50 Mcg/act Susp (Fluticasone propionate) .Marland Kitchen.. 1 spray/side once daily 6)  Cephalexin 500 Mg Caps (Cephalexin) .Marland Kitchen.. 1po three times a day 7)  Fluconazole 150 Mg Tabs (Fluconazole) .Marland Kitchen.. 1 by mouth every other day as needed 8)  Hydrocodone-homatropine 5-1.5 Mg/44ml Syrp (Hydrocodone-homatropine) .Marland Kitchen.. 1 tsp by mouth q 6 hrs as needed cough  Patient Instructions: 1)  Please take all new medications as prescribed 2)  Continue all previous medications as before this visit  3)  Please schedule a follow-up appointment 4 mo with CPX labs Prescriptions: HYDROCODONE-HOMATROPINE 5-1.5 MG/5ML SYRP (HYDROCODONE-HOMATROPINE) 1 tsp by mouth q 6 hrs as needed cough  #6 oz x 1   Entered and Authorized by:   Corwin Levins MD   Signed by:   Corwin Levins MD on 06/28/2010   Method used:   Print then Give to Patient   RxID:   [id number] FLUCONAZOLE 150 MG TABS (FLUCONAZOLE) 1 by mouth  every other day as needed  #2 x 1   Entered and Authorized by:   Corwin Levins MD   Signed by:   Corwin Levins MD on 06/28/2010   Method used:   Print then Give to Patient   RxID:   [id number] CEPHALEXIN 500 MG CAPS (CEPHALEXIN) 1po three times a day  #30 x 0   Entered and Authorized by:   Corwin Levins MD   Signed by:   Corwin Levins MD on 06/28/2010   Method used:   Print then Give to Patient   RxID:   [id number]
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
## 2011-01-09 NOTE — Progress Notes (Signed)
  Phone Note Refill Request  on January 06, 2010 11:12 AM  Refills Requested: Medication #1:  MECLIZINE HCL 12.5 MG TABS 1 - 2 by mouth q 6 hrs as needed dizzy   Dosage confirmed as above?Dosage Confirmed   Notes: [facility] Initial call taken by: Scharlene Gloss,  January 06, 2010 11:12 AM    Prescriptions: FLUTICASONE PROPIONATE 50 MCG/ACT SUSP (FLUTICASONE PROPIONATE) 1 spray/side once daily  #3 x 3   Entered by:   Scharlene Gloss   Authorized by:   Corwin Levins MD   Signed by:   Scharlene Gloss on 01/06/2010   Method used:   Faxed to ...       [facility] Fax) (mail-order)             ,          Ph: [phone]       Fax: [phone]   RxID:   [id number]

## 2011-01-11 NOTE — Progress Notes (Signed)
Summary: Patient wants a z-pack for sinus infection.  Phone Note Call from Patient   Caller: Patient Call For: Corwin Levins MD Summary of Call: Patient stated she is having a sinus infection again. Patient states this is an on-going problem and requests that the M.D. call in a z-pack for her. [facility] [address] [facility] is [phone]. Please Advise. Initial call taken by: Daphane Shepherd,  December 27, 2010 9:46 AM  Follow-up for Phone Call        last OV for sinus problem 06/2010, office policy is no abx w/o recent eval for infx problem - sorry, no - continue nasal spray (flonase) and otc meds, call for OV if fever or increasing pain Follow-up by: Newt Lukes MD,  December 27, 2010 3:07 PM  Additional Follow-up for Phone Call Additional follow up Details #1::        called pt. informed of above information. She agreed to schedule an OV, transfered her to a scheduler to schedule appt. Additional Follow-up by: Robin Ewing CMA Duncan Dull),  December 27, 2010 4:39 PM

## 2011-01-11 NOTE — Assessment & Plan Note (Signed)
Summary: DR Sammuel Cooper PT/NO CLINIC PER ROBIN SCHED--SINUS PROBLEM  STC   Vital Signs:  Patient profile:   50 year old female Height:      64 inches (162.56 cm) Weight:      231 pounds (105.00 kg) O2 Sat:      98 % on Room air Temp:     98.6 degrees F (37.00 degrees C) oral Pulse rate:   74 / minute BP sitting:   112 / 84  (left arm) Cuff size:   large  Vitals Entered By: Orlan Leavens RMA (December 28, 2010 9:17 AM)  O2 Flow:  Room air CC: Sinus problem, URI symptoms Is Patient Diabetic? No Pain Assessment Patient in pain? no        Primary Care Provider:  Corwin Levins MD  CC:  Sinus problem and URI symptoms.  History of Present Illness:  URI Symptoms      This is a 50 year old woman who presents with URI symptoms.  The symptoms began 2 weeks ago.  The severity is described as severe.  using otc med without relief. hx same, constant pressure on left side, now acute flare x 2 weeks.  The patient reports nasal congestion, purulent nasal discharge, productive cough, and earache, but denies sore throat, dry cough, and sick contacts.  Associated symptoms include low-grade fever (<100.5 degrees).  The patient denies stiff neck, dyspnea, wheezing, vomiting, and diarrhea.  The patient also reports itchy throat, headache, muscle aches, and severe fatigue.  The patient denies sneezing, seasonal symptoms, and response to antihistamine.  Risk factors for Strep sinusitis include unilateral facial pain, unilateral nasal discharge, and tooth pain.  The patient denies the following risk factors for Strep sinusitis: Strep exposure, tender adenopathy, and absence of cough.    Current Medications (verified): 1)  Levocetirizine Dihydrochloride 5 Mg Tabs (Levocetirizine Dihydrochloride) .Marland Kitchen.. 1po Once Daily As Needed Allergies 2)  Meclizine Hcl 12.5 Mg Tabs (Meclizine Hcl) .Marland Kitchen.. 1 - 2 By Mouth Q 6 Hrs As Needed Dizzy 3)  Fluticasone Propionate 50 Mcg/act Susp (Fluticasone Propionate) .Marland Kitchen.. 1 Spray/side Once  Daily 4)  Fluconazole 150 Mg Tabs (Fluconazole) .Marland Kitchen.. 1po Q 3 Days As Needed  Allergies (verified): 1)  ! Bactrim Ds (Sulfamethoxazole-Trimethoprim)  Past History:  Past Medical History: Allergic rhinitis - perennial Low back pain - recurrent right sciatica  Review of Systems  The patient denies weight loss, vision loss, decreased hearing, hoarseness, prolonged cough, and hemoptysis.    Physical Exam  General:  alert and overweight-appearing.  mod ill and congested Eyes:  vision grossly intact; pupils equal, round and reactive to light.  conjunctiva and lids normal.    Ears:  L ear with serous effusion, min erythema - R TM clear Nose:  tender to palp over left max sinus region - mild ulceration of septum on left side, no vesicles Mouth:  teeth and gums in good repair; mucous membranes moist, without lesions or ulcers. oropharynx clear without exudate, mild erythema. +pnd Lungs:  normal respiratory effort, no intercostal retractions or use of accessory muscles; normal breath sounds bilaterally - no crackles and no wheezes.    Heart:  normal rate, regular rhythm, no murmur, and no rub. BLE without edema.   Impression & Recommendations:  Problem # 1:  SINUSITIS- ACUTE-NOS (ICD-461.9)  recurrent left side maxillary symptoms with chronic allg rhinitis/sinusitus retx abx now - cont nasla steroid and add 1 week decongestant - refer for CT sinus and ENT depending on results The following  medications were removed from the medication list:    Cephalexin 500 Mg Caps (Cephalexin) .Marland Kitchen... 1po three times a day Her updated medication list for this problem includes:    Fluticasone Propionate 50 Mcg/act Susp (Fluticasone propionate) .Marland Kitchen... 1 spray/side once daily    Augmentin 875-125 Mg Tabs (Amoxicillin-pot clavulanate) .Marland Kitchen... 1 by mouth two times a day x 7 days    Sudafed 30 Mg Tabs (Pseudoephedrine hcl) .Marland Kitchen... 1 by mouth two times a day x 7 days, then as needed for  congestion  Orders: Prescription Created Electronically [phone]) Misc. Referral (Misc. Ref)  Problem # 2:  ALLERGIC RHINITIS (ICD-477.9)  Her updated medication list for this problem includes:    Levocetirizine Dihydrochloride 5 Mg Tabs (Levocetirizine dihydrochloride) .Marland Kitchen... 1po once daily as needed allergies    Fluticasone Propionate 50 Mcg/act Susp (Fluticasone propionate) .Marland Kitchen... 1 spray/side once daily    Sudafed 30 Mg Tabs (Pseudoephedrine hcl) .Marland Kitchen... 1 by mouth two times a day x 7 days, then as needed for congestion  treat as above, f/u any worsening signs or symptoms  -  Complete Medication List: 1)  Levocetirizine Dihydrochloride 5 Mg Tabs (Levocetirizine dihydrochloride) .Marland Kitchen.. 1po once daily as needed allergies 2)  Meclizine Hcl 12.5 Mg Tabs (Meclizine hcl) .Marland Kitchen.. 1 - 2 by mouth q 6 hrs as needed dizzy 3)  Fluticasone Propionate 50 Mcg/act Susp (Fluticasone propionate) .Marland Kitchen.. 1 spray/side once daily 4)  Fluconazole 150 Mg Tabs (Fluconazole) .Marland Kitchen.. 1po q 3 days as needed 5)  Augmentin 875-125 Mg Tabs (Amoxicillin-pot clavulanate) .Marland Kitchen.. 1 by mouth two times a day x 7 days 6)  Sudafed 30 Mg Tabs (Pseudoephedrine hcl) .Marland Kitchen.. 1 by mouth two times a day x 7 days, then as needed for congestion  Patient Instructions: 1)  it was good to see you today. 2)  Augmentin antibiotics and sudafed for decongestant - your prescriptions have been electronically submitted to your pharmacy. Please take as directed. Contact our office if you believe you're having problems with the medication(s).  3)  continue flonase spray and Netti pot irrigation 4)  we'll make referral for CT scan to look at anatomy. Our office will contact you regarding this appointment once made. will call you with these results once reviewed and consider referral to ENT as needed  5)  Please schedule an appointment with your primary doctor in :6 weeks to review symptoms and medications Prescriptions: AUGMENTIN 875-125 MG TABS (AMOXICILLIN-POT  CLAVULANATE) 1 by mouth two times a day x 7 days  #14 x 0   Entered and Authorized by:   Newt Lukes MD   Signed by:   Newt Lukes MD on 12/28/2010   Method used:   [facility]        [facility] [address] [facility] [phone][facility] (retail)       [address] [place], [place]  [address]       Ph: [phone]       Fax: [phone]   RxID:   [id number]    Orders Added: 1)  Est. Patient Level IV [74259] 2)  Prescription Created Electronically [[id number]] 3)  Misc. Referral [Misc. Ref]

## 2011-01-17 ENCOUNTER — Telehealth: Payer: Self-pay | Admitting: Internal Medicine

## 2011-01-25 NOTE — Progress Notes (Signed)
Summary: ABX refill req  Phone Note Call from Patient   Caller: Patient [phone] Summary of Call: Pt called stating she still has sxs of sinus infection and is requesting refill of ABX, no fever but cough, congestion and ear ache. Initial call taken by: Margaret Pyle, CMA,  January 17, 2011 3:15 PM  Follow-up for Phone Call        ?did she get her ct 12/29/10 as scheduled? i need to see these results if done - ok to retreat 10d more abx for sinus symptoms and if still unmproved, call for OV with PCP (jwj) to consider ent eval Follow-up by: Newt Lukes MD,  January 17, 2011 4:46 PM  Additional Follow-up for Phone Call Additional follow up Details #1::        left message on machine for pt to return my call. Margaret Pyle, CMA  January 18, 2011 8:17 AM  Per CT, pt did not go to appt, pt adivsed of ABX and told to make appt with JWJ id no improvement Additional Follow-up by: Margaret Pyle, CMA,  January 18, 2011 9:38 AM    New/Updated Medications: AUGMENTIN 875-125 MG TABS (AMOXICILLIN-POT CLAVULANATE) 1 by mouth two times a day x 10days Prescriptions: AUGMENTIN 875-125 MG TABS (AMOXICILLIN-POT CLAVULANATE) 1 by mouth two times a day x 10days  #20 x 0   Entered and Authorized by:   Newt Lukes MD   Signed by:   Newt Lukes MD on 01/17/2011   Method used:   Electronically to        [facility] [address] [facility] [phone]* (retail)       [address] [place], [place]  [address]       Ph: [phone]       Fax: [phone]   RxID:   [id number]

## 2011-02-19 ENCOUNTER — Other Ambulatory Visit: Payer: Self-pay

## 2011-03-08 ENCOUNTER — Other Ambulatory Visit: Payer: Self-pay

## 2011-03-08 MED ORDER — LEVOCETIRIZINE DIHYDROCHLORIDE 5 MG PO TABS: 5.0000 mg | ORAL_TABLET | Freq: Every evening | ORAL | Status: DC

## 2011-03-08 NOTE — Telephone Encounter (Signed)
Pharmacy requesting refill on patient allergy medication

## 2011-03-14 LAB — POCT CARDIAC MARKERS
CKMB, poc: <1 ng/mL — ABNORMAL LOW (ref 1.0–8.0)
Myoglobin, poc: 52.5 ng/mL (ref 12–200)
Troponin i, poc: <0.05 ng/mL (ref 0.00–0.09)

## 2011-03-14 LAB — URINE MICROSCOPIC-ADD ON

## 2011-03-14 LAB — URINALYSIS, ROUTINE W REFLEX MICROSCOPIC
Bilirubin Urine: NEGATIVE
Glucose, UA: NEGATIVE mg/dL
Ketones, ur: NEGATIVE mg/dL
Leukocytes, UA: NEGATIVE
Nitrite: NEGATIVE
Protein, ur: NEGATIVE mg/dL
Specific Gravity, Urine: 1.006 (ref 1.005–1.030)
Urobilinogen, UA: 0.2 mg/dL (ref 0.0–1.0)
pH: 6.5 (ref 5.0–8.0)

## 2011-03-14 LAB — POCT I-STAT, CHEM 8
BUN: 11 mg/dL (ref 6–23)
Calcium, Ion: 1.06 mmol/L — ABNORMAL LOW (ref 1.12–1.32)
Chloride: 105 mEq/L (ref 96–112)
Creatinine, Ser: 0.7 mg/dL (ref 0.4–1.2)
Glucose, Bld: 110 mg/dL — ABNORMAL HIGH (ref 70–99)
HCT: 39 % (ref 36.0–46.0)
Hemoglobin: 13.3 g/dL (ref 12.0–15.0)
Potassium: 3.9 mEq/L (ref 3.5–5.1)
Sodium: 139 mEq/L (ref 135–145)
TCO2: 24 mmol/L (ref 0–100)

## 2011-03-14 LAB — PROTIME-INR
INR: 0.94 (ref 0.00–1.49)
Prothrombin Time: 12.5 seconds (ref 11.6–15.2)

## 2011-03-14 LAB — DIFFERENTIAL
Basophils Absolute: 0 10*3/uL (ref 0.0–0.1)
Basophils Relative: 0 % (ref 0–1)
Eosinophils Absolute: 0.1 10*3/uL (ref 0.0–0.7)
Eosinophils Relative: 2 % (ref 0–5)
Lymphocytes Relative: 16 % (ref 12–46)
Lymphs Abs: 1.4 10*3/uL (ref 0.7–4.0)
Monocytes Absolute: 0.6 10*3/uL (ref 0.1–1.0)
Monocytes Relative: 7 % (ref 3–12)
Neutro Abs: 6.4 10*3/uL (ref 1.7–7.7)
Neutrophils Relative %: 75 % (ref 43–77)

## 2011-03-14 LAB — CBC
HCT: 36.9 % (ref 36.0–46.0)
Hemoglobin: 12.6 g/dL (ref 12.0–15.0)
MCHC: 34.2 g/dL (ref 30.0–36.0)
MCV: 93.3 fL (ref 78.0–100.0)
Platelets: 248 10*3/uL (ref 150–400)
RBC: 3.96 MIL/uL (ref 3.87–5.11)
RDW: 12.4 % (ref 11.5–15.5)
WBC: 8.5 10*3/uL (ref 4.0–10.5)

## 2011-04-27 NOTE — H&P (Signed)
NAMEKILA, GODINA NO.:  [id number]   MEDICAL RECORD NO.:  [id number]          PATIENT TYPE:  MAT   LOCATION:  [facility]                          [facility]ILITY:  [facility]   PHYSICIAN:  Hal Morales, M.D.DATE OF BIRTH:  17-Aug-1961   DATE OF ADMISSION:  12/21/2004  DATE OF DISCHARGE:                                HISTORY & PHYSICAL   This is a 50 year old, gravida 3, para 1-0-1-1, at 19-6/7 weeks, who  presents with complaints of gush of fluid at 7:00 a.m.  She denies uterine  contractions, but does have some low back pain.  She denies bleeding.  Pregnancy has been followed by the M.D. Service and remarkable for:  1.  AMA.  2.  First trimester bleeding.  3.  Previous C-section.  4.  Fibroids.  5.  Husband with HSV-2.   OB HISTORY:  Remarkable for:  1.  C-section in 1983 of a female infant at [redacted] weeks gestation weighing 6      pounds 11 ounces for failure to progress.  2.  She had a spontaneous abortion in 2001 at [redacted] weeks gestation with no D&C.   MEDICAL HISTORY:  Remarkable for:  1.  History of anemia with her first pregnancy.  2.  History of 2 subserosal fibroids, the largest being 6 cm.  3.  History of childhood varicella.  4.  History of a kidney infection 15 years ago.   PAST SURGICAL HISTORY:  Remarkable for:  1.  A tonsillectomy in 1987.  2.  A C-section in 1983.   FAMILY HISTORY:  Remarkable for:  1.  Grandmother with heart failure.  2.  Parents and sister with hypertension.  3.  Grandfather with stroke.  4.  Father with heart disease.  5.  Brother with heart disease.  6.  Brother with diabetes.  7.  Grandmother with kidney failure.  8.  Mother with uterine cancer and gout and arthritis.   GENETIC HISTORY:  Remarkable for:  1.  A cousin with mental retardation.  2.  Several sets of twins on both sides.   SOCIAL HISTORY:  Patient is married to Golden Hurter who is involved and  supportive.  She is of the [employer].  She denies any alcohol,  tobacco  or drug use.  She works as an Print production planner.   ALLERGIES:  1.  BACTRIM causes fainting.  2.  She also has allergies to RAGWEED, MOLD DUST and CIGARETTE SMOKE.   PRENATAL LABS:  Hemoglobin 11.7, platelets 272, blood type A+, antibody  screen negative, sickle cell negative,  RPR nonreactive, rubella immune,  hepatitis negative,  HIV negative, Pap test normal in March.  HSV-1 and 2  positive glycoproteins.   HISTORY OF CURRENT PREGNANCY:  Patient under care at [redacted] weeks gestation.  Discussion was had regarding VBAC versus repeat C-section and amniocentesis.  Amniocentesis was declined and no further visits are documented since late  November on current records.   OBJECTIVE DATA:  VITAL SIGNS:  Stable, afebrile.  HEENT:  Within normal limits.  THYROID:  Normal, nonenlarged.  CHEST:  Clear to auscultation.  HEART RATE:  Regular rate and rhythm.  ABDOMEN:  Soft, nontender, gravid at umbilicus.  Fetal heart tones 148.  Sterile speculum exam - positive pooling of clear fluid, positive Nitrazine,  positive ferning.  Cervix is fingertip to 1, 50%, -3 vertex, questionable  vertex.  UA shows specific gravity of 1.010 with 15 of ketones and otherwise  negative.  GC, chlamydia and group B Strep were done.  EXTREMITIES:  Within normal limits.   Fetal monitor Toco shows uterine contractions every 1-1/2 to 2 minutes,  which patient describes as abdominal gas pains.   ASSESSMENT:  1.  Intrauterine pregnancy at 19-6/7 weeks.  2.  Preterm premature rupture of membranes.  3.  Preterm contractures.   PLAN:  1.  Dr. Pennie Rushing consulted.  2.  Will admit to AICU.  3.  Routine labs and further recommendations to follow.     Mari   MLW/MEDQ  D:  12/21/2004  T:  12/21/2004  Job:  [id number]

## 2011-04-27 NOTE — Consult Note (Signed)
[facility]  Patient:    Melody Lewis, Melody Lewis                         MRN: [id number] Adm. Date:  10/27/00 Attending:  Gaetano Hawthorne. Lily Peer, M.D.                          Consultation Report  HISTORY OF PRESENT ILLNESS:   The patient is a 50 year old, gravida 2, para 1 with history of irregular periods who stated that she believes her last period was the first week of September and no period in October, and she had a urine pregnancy test at home this past Wednesday, and it was ______ positive. She went to the family practice office, and they confirmed it. On Wednesday, she had a urine pregnancy that was positive. She has had some bleeding on and off since this past Wednesday, November 14. The patient has had some minimal back discomfort, but no major cramping reported today. However, no bleeding at all. She was seen in our office this past Friday on the 16th because of a type of vaginal discharge that she was having and found to have bacterial vaginosis and was started on Cleocin 300 mg one p.o. b.i.d.  PHYSICAL EXAMINATION:  VITAL SIGNS:                  Her vital signs in the emergency room were as follows:  Temperature 98.4, respirations 20. Her blood pressures were as follows:  145/76 lying down, 128/79 sitting, and standing 135/70, with a pulse essentially unchanged at 80 beats per minute.  ABDOMEN:                      Soft, nontender.  PELVIC:                       External genitalia without any gross abnormality. Vaginal vault is essentially dry, some mild old blood was seen but no bright red blood or active bleeding. The cervical os is closed. Since the patient just had an ultrasound, a bimanual pelvic exam was not done.  LABORATORY DATA:              The ultrasound demonstrated uterus measuring 8.2 x 5.5 x 6.9 cm with a thickened endometrium, multiple fibroids of various dimensions, the largest one being fundally located pediculated measuring 3.7  x 3.6 x 3.3 cm, multiple nabothian cysts in the cervix and patient with ______ cystic structure described being near the cervix with questionable gestational sac measuring approximately 7.7 mm consistent with 5.3 weeks. Left ovary had a corpus luteum cyst. Right ovary was normal.  Her quantitative beta-HCG was 2193. Her blood type was A positive. Hemoglobin and hematocrit were as follows:  12.3 and 36.9 respectively, with platelet count 233,000, white blood count 6.9.  ASSESSMENT AND PLAN:          With the above findings it was decided perhaps the patient has an early pregnancy with threatened AB. The patient has no history of PID in the past. Recently under treatment for bacterial vaginosis with Cleocin 300 mg b.i.d. p.o. The patient was given threatened AB precaution. Will follow up with a quantitative beta-HCG in the next 48 hours in the office and follow-up ultrasound. If she begins to bleed or pass large quantities of blood or tissue, she will report to the emergency room immediately.  At that point, we will proceed with a D&E, if clinically indicated. If not, we will give her the benefit that perhaps this a threatened AB with an early intrauterine pregnancy and follow the trend of the ______ ultrasound. All questions are answered, and we will follow accordingly. DD:  10/27/00 TD:  10/27/00 Job: [id number]

## 2011-04-27 NOTE — Op Note (Signed)
Melody Lewis, Melody Lewis NO.:  [id number]   MEDICAL RECORD NO.:  [id number]           PATIENT TYPE:   LOCATION:                                 FACILITY:   PHYSICIAN:  Hal Morales, M.D.     DATE OF BIRTH:   DATE OF PROCEDURE:  01/08/2005  DATE OF DISCHARGE:                                 OPERATIVE REPORT   PREOPERATIVE DIAGNOSIS:  Retained placenta, uterine fibroids.   POSTOPERATIVE DIAGNOSIS:  Retained placenta, uterine fibroids.   OPERATION:  Removal of retained placenta, suction curettage under ultrasound  guidance.   SURGEON:  Vanessa P. Pennie Rushing, M.D.   ANESTHESIA:  Monitored anesthesia care.   SPECIMENS TO PATHOLOGY:  Retained products of conception and placenta.   ESTIMATED BLOOD LOSS:  750 cubic centimeters during the procedure and  approximately 500 cubic centimeters of clots in the vagina.   PROCEDURE:  A discussion was held with the patient concerning the need for  operative removal of the placenta after manual removal was unsuccessful.  A  discussion concerning the risks of anesthesia, bleeding, infection, and  damage to adjacent organs as well as uterine perforation was undertaken.  The risk of infection and bleeding if this were not undertaken were likewise  explained.  The patient and husband consented and she was moved to the  operating room.  She was placed in the operating room in the lithotomy  position and equipment was placed for monitored anesthesia care.  The  perineum and vagina were prepped with multiple layers of Betadine and draped  as a sterile field.  A red Robinson catheter was used to empty the bladder.  A weighted speculum was placed in the posterior vagina and clots removed  from the vagina.  As much of the placenta as could be walked out between  ring forceps was removed and a #12 suction curet used to suction the  remainder of the products of conception.  Ultrasound guidance was used to  identify remaining areas of  products of conception in the lower uterine  segment and this was removed with sharp curet under ultrasound guidance  until the endometrial stripe was noted to be clear.  Intravenous Pitocin was  being given during the procedure to minimize blood los and one dose of  Methergine 0.2 mg IM was given during the procedure.  Once the ultrasound  had documented removal of all of the products of conception, the hemostasis  was noted to be adequate.  A single-toothed tenaculum which had been placed  on the anterior cervix was removed and a figure-of-eight suture of 2-0  chromic was placed to allow hemostasis at the site of  the tenaculum puncture.  At this time, hemostasis was noted to be adequate  and the patient was taken from the operating room to the recovery room in  satisfactory condition having tolerated the procedure well with sponge and  instrument count correct.      VPH/MEDQ  D:  01/08/2005  T:  01/08/2005  Job:  [id number]

## 2011-04-27 NOTE — Discharge Summary (Signed)
NAMESYDNE, KRAHL NO.:  [id number]   MEDICAL RECORD NO.:  [id number]          PATIENT TYPE:  INP   LOCATION:  9320                          [facility]ILITY:  [facility]   PHYSICIAN:  Osborn Coho, M.D.   DATE OF BIRTH:  [birth year]   DATE OF ADMISSION:  05/22/2005  DATE OF DISCHARGE:  05/24/2005                                 DISCHARGE SUMMARY   DISCHARGE DIAGNOSIS:  Symptomatic uterine fibroids.   OPERATION:  On the day of admission the patient underwent a total abdominal  hysterectomy tolerating procedure well. The patient was found to have an  approximately 13-15 weeks size uterus with normal-appearing tubes and  ovaries.   HISTORY OF PRESENT ILLNESS:  Melody Lewis is a 50 year old female para 1-1-1-1  who presents for hysterectomy because of symptomatic uterine fibroids.  Please see the patient's dictated history and physical examination for  details.   PREOPERATIVE PHYSICAL EXAM:  General exam was within normal limits. Pelvic  examination; external genitalia within normal limits. Uterus was enlarged  and there were no palpable adnexal masses.   HOSPITAL COURSE:  On the date of admission the patient underwent  aforementioned procedures tolerated it well. Postop hemoglobin was 10.6  (preop hemoglobin 12.5). However, the patient was asymptomatic with this low  hemoglobin. By postop day #2 the patient had resumed bowel and bladder  function and was therefore deemed ready for discharge home.   DISCHARGE MEDICATIONS:  1.  Percocet 5/325 1-2 tablets every 4 to 6 hours as needed for pain  2.  Motrin 600 milligrams 1 tablet with food every 6 hours as needed for      mild to moderate pain.   FOLLOW UP:  The patient is scheduled for 6 weeks postoperative visit with  Dr. Su Hilt on June 28, 2005 at 10:45 a.m. at [facility] new  location, [address] [place] [address].   DISCHARGE INSTRUCTIONS:  The patient was given a copy of [facility]  postoperative instruction sheet. She was further advised to call with  any temperature greater than 100.4, chills, vomiting or severe abdominal  pain. The patient was also told to avoid driving for 2 weeks, heavy lifting  for 4 weeks, intercourse for 6 weeks. The patient's diet was without  restriction.   FINAL PATHOLOGY:  Uterus and cervix: Chronic cervicitis with squamous  metaplasia and no intraepithelial lesion identified. Endometrium revealed  proliferative endometrium, no hyperplasia or malignancy identified.  Myometrium multiple leiomyomas.       EJP/MEDQ  D:  06/28/2005  T:  06/28/2005  Job:  [id number]

## 2011-04-27 NOTE — Op Note (Signed)
[name], [name] NO.:  [id number]   MEDICAL RECORD NO.:  [id number]          PATIENT TYPE:  INP   LOCATION:  9320                          FACILITY:  [facility]   PHYSICIAN:  Osborn Coho, M.D.   DATE OF BIRTH:  [birth year]   DATE OF PROCEDURE:  06/03/2005  DATE OF DISCHARGE:  05/24/2005                                 OPERATIVE REPORT   PREOPERATIVE DIAGNOSIS:  Symptomatic fibroids.   POSTOPERATIVE DIAGNOSIS:  Symptomatic fibroids.   OPERATION/PROCEDURE:  Total abdominal hysterectomy.   SURGEON:  Osborn Coho, M.D.   ASSISTANT:  Naima A. Normand Sloop, M.D.   ANESTHESIA:  General.   IV FLUIDS:  2400 mL.   ESTIMATED BLOOD LOSS:  400 mL.   URINARY OUTPUT:  825 mL.   COMPLICATIONS:  None.   SPECIMENS:  Uterus and cervix.   FINDINGS:  Approximately 13 to 15 week size fibroid uterus.   DESCRIPTION OF PROCEDURE:  The patient was taken to the operating room after  the risks, benefits and alternatives reviewed with the patient.  The patient  verbalized understanding and consent signed and witnesses.  The patient was  placed under general anesthesia and prepped and draped in the normal sterile  fashion in the dorsal supine position.  A Pfannenstiel incision was made at  that time site of the prior scar and carried down to the underlying layer of  fascia with the Bovie.  The fascia was excised bilaterally in the midline  and extended bilaterally with the Mayo scissors.  Kocher clamps were placed  on the superior aspect of the fascial incision and the rectus muscle excised  from the fascia.  The same was done on the inferior aspect of the fascial  incision.  The muscle was separated in the midline and the peritoneum  entered bluntly.  The peritoneum was extended manually and with the  Metzenbaum scissors.  The bowel was packed away.  The laparotomy sponges and  O'Connor-O'Sullivan self-retractor was placed.  The uterus was elevated out  of the pelvis and two  large Kellys were placed on the cornua.  The round  ligaments were identified bilaterally and clamped, cut and suture ligated  with 0 Vicryl.  The utero-ovarian pedicle was then clamped, cut and suture  ligated bilaterally.  A free tie was placed on each pedicle of the utero-  ovarian prior to suture ligation.  The uterine vessels were skeletonized  bilaterally after the bladder was dissected away from the cervix.  The  uterine vessels were clamped with a Heaney clamp, bilaterally cut and suture  ligated with 0 Vicryl.  Sequentially and bilaterally the paracervical tissue  was clamped with a straight Heaney clamp, cut and suture ligated  incorporating the cardinal and uterosacral ligaments bilaterally.  A curved  Heaney was placed at the lower portion of the cervix and the tissue was  clamped, cut and suture ligated using a fixation stitch.  The uterus and  cervix were then removed and handed off to be sent to pathology.  The  vaginal cuff was repaired with 0 Vicryl using figure-of-eight stitches.  There was slight oozing beneath the bladder and hemostasis was assured and  Surgicel applied.  Copious irrigation was performed and hemostasis of all  pedicles was noted.  All instruments were removed.  The patient peritoneum  was closed with 3-0 chromic in a running fashion.  The fascia was repaired  with 0 Vicryl in a running fashion.  The skin was reapproximated using a  subcuticular stitch of 3-0 Monocryl.  Sponge, lap and needle counts were  correct.  The patient tolerated the procedure well and was returned to the  recovery room in good condition.       AR/MEDQ  D:  06/03/2005  T:  06/04/2005  Job:  [id number]

## 2011-04-27 NOTE — Discharge Summary (Signed)
Melody Lewis, BLAYDES NO.:  [id number]   MEDICAL RECORD NO.:  [id number]          PATIENT TYPE:  INP   LOCATION:  9306                          FACILITY:  [facility]   PHYSICIAN:  Janine Limbo, M.D.DATE OF BIRTH:  26-Dec-1960   DATE OF ADMISSION:  12/21/2004  DATE OF DISCHARGE:  01/10/2005                                 DISCHARGE SUMMARY   ADMITTING DIAGNOSES:  1.  Intrauterine pregnancy at 40 and 6/7 weeks.  2.  Advanced maternal age.  3.  Fibroids.  4.  History of herpes simplex virus 2.  5.  Previous cesarean section.  6.  First trimester bleeding.   PREOPERATIVE DIAGNOSES:  1.  Premature preterm rupture of membranes.  2.  Intrauterine fetal demise at 22 weeks.  3.  Retained placenta with dilation and curettage under ultrasound guidance.  4.  Probable chorioamnionitis.   DISCHARGE DIAGNOSES:  1.  Premature preterm rupture of membranes.  2.  Intrauterine fetal demise at 22 weeks.  3.  Retained placenta with dilation and curettage under ultrasound guidance.  4.  Probable chorioamnionitis.  5.  Intrauterine pregnancy at 19 and 6/7 weeks.  6.  Advanced maternal age.  7.  Fibroids.  8.  History of herpes simplex virus 2.  9.  Previous cesarean section.  10. First trimester bleeding.   [name] is a 50 year old gravida 3, para 1-0-1-1 who presents at 89 and  6/7 weeks on December 21, 2004 with preterm premature rupture of  membranes  and preterm contractions.  She was maintained on strict bed rest in  Trendelenburg position.  The patient remained stable with no bleeding, no  vaginal discharge until December 30, 2004, the patient did notice some  bleeding from the vagina.  At that time, an ultrasound showed placenta  anterior grade 2 with no previa.  Premature rupture of membranes was checked  on admission and ultrasound examination of the pregnancy repeatedly showed a  decreased amount of amniotic fluid at 1.9 cm.  The patient continued to have  only  minimal or no vaginal bleeding until January 03, 2005.  She had  episodic bleeding episodes.  She was not having any cramping or pain.  No  contractions were picked up on fetal monitoring; however, on the morning of  January 07, 2005, ultrasound examination of the fetus showed no fetal heart  tones for a diagnosis of IUFD.  The risks and benefits were entered into  with the patient at that time and she agreed to induction of labor for  vaginal delivery.  She gave birth on January 08, 2005 to a nonviable infant  female named Melody Lewis and experienced a retained placenta.  She was taken to  the OR at approximately 10 a.m. on January 08, 2005 by Dr. Dierdre Forth  and underwent D&C for removal of products of conception.  Following D&C, her  hemoglobin decreased to 7.9 and she became hypotensive and tachycardic.  She  was transfused with 3 units of packed red blood cells and felt better  following transfusion.  On this, her second postpartum day, she is  judged to  be in satisfactory condition for discharge.  A CBC will be checked on time  again and if her wbc's are less than or equal to 20,000, then she is okay  for discharge with Augmentin 500 mg p.o. t.i.d. x7 days and Motrin 600 mg  p.o. q.6h. p.r.n. pain, Percocet one p.o. q.3-4h. p.r.n. pain and she will  return to the office of [facility] in 2 weeks to see Dr. Pennie Rushing for followup.      SDM/MEDQ  D:  01/10/2005  T:  01/10/2005  Job:  [id number]

## 2011-04-27 NOTE — H&P (Signed)
NAMEFELISA, Melody Lewis NO.:  [id number]   MEDICAL RECORD NO.:  [id number]          PATIENT TYPE:  INP   LOCATION:  NA                            FACILITY:  [facility]   PHYSICIAN:  Osborn Coho, M.D.   DATE OF BIRTH:  1961-11-21   DATE OF ADMISSION:  DATE OF DISCHARGE:                                HISTORY & PHYSICAL   CHIEF COMPLAINT:  Heavy menses and painful periods secondary to fibroids.   HISTORY:  Melody Lewis is a 50 year old gravida 3, para 1-1-1-1, with a last  menstrual period of May 09, 2005, with menorrhagia and dysmenorrhea  secondary to fibroids.   PAST OBSTETRICAL HISTORY:  C-section full-term x1.  Spontaneous abortion x1,  first trimester and pregnancy loss in the second trimester.   PAST GYN HISTORY:  History of irregular menses.  Denies a history of  abnormal Pap smears.  Denies history of gonorrhea, Chlamydia.  Last  mammogram March 08, 2005, which was negative.  The patient was diagnosed  with fibroids about four years ago after the first miscarriage.   PAST MEDICAL HISTORY:  Denies hypertension, diabetes.   PAST SURGICAL HISTORY:  C-section, D&E and tonsillectomy.   MEDICATIONS:  Zyrtec.   ALLERGIES:  BACTRIM and SEASONAL ALLERGIES.   SOCIAL HISTORY:  Denies cigarette use, alcohol use and drug use.  Lives with  her husband.   FAMILY HISTORY:  Maternal aunt died of colon cancer in her 87s,  hypertension, maternal grandmother with end-stage renal disease.   REVIEW OF SYSTEMS:  Denies chest pain or shortness of breath with activity.  Denies fever, chills, nausea, vomiting, diarrhea, GI or GU problems.   PHYSICAL EXAMINATION:  HEENT:  Within normal limits.  NECK:  Thyroid not enlarged.  CARDIAC:  Heart rate and rhythm are regular.  CHEST:  Clear to auscultation bilaterally.  BREASTS:  Breast exam March 05, 2005, no masses.  ABDOMEN:  Soft and nontender, no organomegaly.  EXTREMITIES:  Within normal limits.  PELVIC:  External genitalia  within normal limits.  Uterus enlarged, and no  palpable adnexal masses.   Ultrasound shows the uterus approximately 14 weeks in size with seven  fibroids, with the largest measuring 5.3 cm.   ASSESSMENT AND PLAN:  Melody Lewis is a 50 year old gravida 3, para 1, with  symptomatic fibroids.  Several options were discussed with patient, and the  patient desired definitive management with a hysterectomy.  The planned  procedure is abdominal hysterectomy.  The risks, benefits, and alternatives  were reviewed with the patient, including but not limited to bleeding,  infection and injury.  The patient verbalized understanding and consent  signed and witnessed.  Preop labs will be done prior to procedure.       AR/MEDQ  D:  05/22/2005  T:  05/22/2005  Job:  [id number]

## 2011-05-04 ENCOUNTER — Encounter: Payer: Self-pay | Admitting: Internal Medicine

## 2011-05-04 ENCOUNTER — Ambulatory Visit: Payer: Self-pay | Admitting: Internal Medicine

## 2011-05-04 ENCOUNTER — Ambulatory Visit (INDEPENDENT_AMBULATORY_CARE_PROVIDER_SITE_OTHER): Payer: PRIVATE HEALTH INSURANCE | Admitting: Internal Medicine

## 2011-05-04 DIAGNOSIS — R21 Rash and other nonspecific skin eruption: Secondary | ICD-10-CM

## 2011-05-04 DIAGNOSIS — M542 Cervicalgia: Secondary | ICD-10-CM

## 2011-05-04 DIAGNOSIS — M549 Dorsalgia, unspecified: Secondary | ICD-10-CM

## 2011-05-04 DIAGNOSIS — Z0000 Encounter for general adult medical examination without abnormal findings: Secondary | ICD-10-CM

## 2011-05-04 DIAGNOSIS — J309 Allergic rhinitis, unspecified: Secondary | ICD-10-CM

## 2011-05-04 MED ORDER — HYDROCODONE-ACETAMINOPHEN 5-325 MG PO TABS: 1.0000 | ORAL_TABLET | Freq: Four times a day (QID) | ORAL | Status: AC | PRN

## 2011-05-04 MED ORDER — TRIAMCINOLONE ACETONIDE 0.1 % EX CREA: TOPICAL_CREAM | Freq: Two times a day (BID) | CUTANEOUS | Status: AC

## 2011-05-04 MED ORDER — LEVOCETIRIZINE DIHYDROCHLORIDE 5 MG PO TABS: 5.0000 mg | ORAL_TABLET | Freq: Every evening | ORAL | Status: DC

## 2011-05-04 MED ORDER — MOMETASONE FUROATE 50 MCG/ACT NA SUSP: 2.0000 | Freq: Every day | NASAL | Status: DC

## 2011-05-04 MED ORDER — CYCLOBENZAPRINE HCL 5 MG PO TABS: 5.0000 mg | ORAL_TABLET | Freq: Three times a day (TID) | ORAL | Status: AC | PRN

## 2011-05-04 MED ORDER — PREDNISONE 10 MG PO TABS: 10.0000 mg | ORAL_TABLET | Freq: Every day | ORAL | Status: AC

## 2011-05-04 NOTE — Assessment & Plan Note (Signed)
Right medial ankle, c/w dermatitis - ? Insect related or contact dermatitis ? - for triam cr prn

## 2011-05-04 NOTE — Assessment & Plan Note (Deleted)
Most likely I think MSK related to the trapezoid strain , but cant r/o left neck radiculitis - for predpack

## 2011-05-04 NOTE — Patient Instructions (Addendum)
Take all new medications as prescribed Continue all other medications as before Please call if you feel you need referral to Plastic Surgury for evaluation The generic xyzal was refilled The flonase was changed to Nasonex Please return in 6 mo with Lab testing done 3-5 days before

## 2011-05-04 NOTE — Assessment & Plan Note (Signed)
C/w prob rather severe left trapezoid strain - for hydrocodone prn, flexeril, consider plastic surgeon eval for breast reduction surgury

## 2011-05-05 ENCOUNTER — Encounter: Payer: Self-pay | Admitting: Internal Medicine

## 2011-05-05 NOTE — Assessment & Plan Note (Signed)
Moderate uncontroled, for nasonex instead of the flonase, Continue all other medications as before ,   to f/u any worsening symptoms or concerns

## 2011-05-05 NOTE — Progress Notes (Signed)
Subjective:    Patient ID: Melody Lewis, female    DOB: [birth year], 50 y.o.   MRN: [id number]  HPI  Here with acute onset 4-5 days left upper back spasm crampy pain, mod to severe , constant and unremitting, with some radiation to the left neck as well as shoulder, without obvious inciting event such as heavy lifting or other trauma, some worse to turning head to the left, as well as left shoudler abduction, though different from the left shoulder bursitis that was tx prior per ortho with cortisone shot;  And no radicular pain, weakness or numbness to the LUE;  Does tend to sleep on the right side for the last 5 nights which has led to numbness to the distal RUE when she wakes up but resolves within minutes of arm extension; No HA, ST cough  And Pt denies chest pain, increased sob or doe, wheezing, orthopnea, PND, increased LE swelling, palpitations, dizziness or syncope.  Pt denies new neurological symptoms such as new headache, or facial or extremity weakness or numbness   Pt denies polydipsia, polyuria.  Does have an new onset 3 days small area itchy red rash to the right inner ankle area , ? Insect bite but no bite to the area.  Does have several wks ongoing nasal allergy symptoms with clear congestion, itch and sneeze, without fever, pain, ST, cough or wheezing, and not using the flonase due to burgning sensation.  Has also had increasing breast size over several yrs requiring larger bra, seems related to ongoing upper back pain as well. Past Medical History  Diagnosis Date  . SINUSITIS- ACUTE-NOS 12/22/2009  . ALLERGIC RHINITIS 10/18/2008  . SHOULDER PAIN, LEFT 10/18/2010  . WRIST PAIN, RIGHT 10/18/2008  . KNEE PAIN, RIGHT 10/18/2008  . LOW BACK PAIN 10/18/2008  . LUMBAR RADICULOPATHY, RIGHT 06/28/2009  . BACK PAIN, UPPER 10/18/2008  . BACK PAIN, UPPER 10/18/2008  . UNSPECIFIED NEURALGIA NEURITIS AND RADICULITIS 10/18/2008  . INTERMITTENT VERTIGO 11/01/2009  . Dysuria 10/18/2010   Past Surgical  History  Procedure Date  . Tonsillectomy   . Abdominal hysterectomy 05/2005    due to fibroids, still born, ovaries intact    reports that she has never smoked. She does not have any smokeless tobacco history on file. She reports that she does not drink alcohol or use illicit drugs. family history includes Arthritis in her father and mother; Colon cancer in her father and maternal aunt; Dementia in her father; Diabetes in her father; Hypertension in her brother, daughter, and mother; Kidney disease in her mother; and Parkinsonism in her father. Allergies  Allergen Reactions  . Sulfamethoxazole W/Trimethoprim     REACTION: Faint   No current outpatient prescriptions on file prior to visit.     Review of Systems All otherwise neg per pt     Objective:   Physical Exam BP 106/78  Pulse 73  Temp(Src) 98.5 F (36.9 C) (Oral)  Ht 5\' 4"  (1.626 m)  Wt 231 lb 2 oz (104.838 kg)  BMI 39.67 kg/m2  SpO2 96% Physical Exam  VS noted, obese Constitutional: Pt appears well-developed and well-nourished.  HENT: Head: Normocephalic.  Right Ear: External ear normal.  Left Ear: External ear normal. Bilat tm's mild erythema.  Sinus nontender.  Pharynx mild erythema Eyes: Conjunctivae and EOM are normal. Pupils are equal, round, and reactive to light.  Neck: Normal range of motion. Neck supple.  Cardiovascular: Normal rate and regular rhythm.   Pulmonary/Chest: Effort normal and breath sounds  normal.  Abd:  Soft, NT, non-distended, + BS Neurological: Pt is alert. No cranial nerve deficit. motor/sens/dtr's intact to UE's Skin: Skin is warm. No erythema. except for approx 1 cm area left medial ankle scaly nontender erythema area, no fluctuance or drainage Psychiatric: Pt behavior is normal. Thought content normal.  MSK:  Marked tender swollen left trapzoid area, no rash, fluctuance;  Neck with FROM and mld left paracervical tender, Left shoulder NT with FROM       Assessment & Plan:

## 2011-05-10 ENCOUNTER — Telehealth: Payer: Self-pay | Admitting: *Deleted

## 2011-05-10 NOTE — Telephone Encounter (Signed)
PA requested for: Nasonex nasal spray [med change on 05/04/11] PA form requested from [employer] @ [phone] Form recvd, completed & faxed for approval 05/10/11

## 2011-05-11 NOTE — Telephone Encounter (Signed)
Approval received & faxed to pharmacy @ [phone] Pt informed.

## 2011-06-19 ENCOUNTER — Ambulatory Visit: Payer: PRIVATE HEALTH INSURANCE | Admitting: Internal Medicine

## 2011-06-20 ENCOUNTER — Ambulatory Visit (INDEPENDENT_AMBULATORY_CARE_PROVIDER_SITE_OTHER): Payer: PRIVATE HEALTH INSURANCE | Admitting: Internal Medicine

## 2011-06-20 ENCOUNTER — Other Ambulatory Visit (INDEPENDENT_AMBULATORY_CARE_PROVIDER_SITE_OTHER): Payer: PRIVATE HEALTH INSURANCE

## 2011-06-20 ENCOUNTER — Encounter: Payer: Self-pay | Admitting: Internal Medicine

## 2011-06-20 DIAGNOSIS — K219 Gastro-esophageal reflux disease without esophagitis: Secondary | ICD-10-CM | POA: Insufficient documentation

## 2011-06-20 DIAGNOSIS — M545 Low back pain, unspecified: Secondary | ICD-10-CM

## 2011-06-20 DIAGNOSIS — R079 Chest pain, unspecified: Secondary | ICD-10-CM | POA: Insufficient documentation

## 2011-06-20 DIAGNOSIS — R21 Rash and other nonspecific skin eruption: Secondary | ICD-10-CM

## 2011-06-20 HISTORY — DX: Gastro-esophageal reflux disease without esophagitis: K21.9

## 2011-06-20 LAB — URINALYSIS, ROUTINE W REFLEX MICROSCOPIC
Bilirubin Urine: NEGATIVE
Ketones, ur: NEGATIVE
Leukocytes, UA: NEGATIVE
Nitrite: NEGATIVE
Specific Gravity, Urine: 1.02 (ref 1.000–1.030)
Total Protein, Urine: NEGATIVE
Urine Glucose: NEGATIVE
Urobilinogen, UA: 0.2 (ref 0.0–1.0)
pH: 6 (ref 5.0–8.0)

## 2011-06-20 MED ORDER — NYSTATIN 100000 UNIT/GM EX POWD: CUTANEOUS | Status: AC

## 2011-06-20 MED ORDER — PANTOPRAZOLE SODIUM 40 MG PO TBEC: 40.0000 mg | DELAYED_RELEASE_TABLET | Freq: Every day | ORAL | Status: DC

## 2011-06-20 NOTE — Progress Notes (Signed)
Subjective:    Patient ID: Melody Lewis, female    DOB: Feb 01, 1961, 50 y.o.   MRN: [id number]  HPI  Here with rash to area under both breast and groin area, ? Worse with hydrocortisone cream, mild for 1-2 wks.  Wondering about UTI and even renal stones, GB dz and even pancreatitis  (but  Denies urinary symptoms such as dysuria, frequency, urgency,or hematuria).  b/c has some mild soreness to touch to the left lower rib cage and to the left lateral rib cage as well around to the left flank area, worse to twist about at the waist.  No shingles type rash.  Also with recurrent gas, bloating, reflux, regurgitation, without n/v, constipation, or other abd pain, dysphagia, wt loss. Denies worsening depressive symptoms, suicidal ideation, or panic, though has ongoing anxiety, and mult family members with the above problems. Does have several wks ongoing nasal allergy symptoms with clear congestion, itch and sneeze, without fever, pain, ST, cough or wheezing, and has gotten away from taking the flonase.  Also worsening reflux, but no dysphagia, abd pain, n/v, bowel change or blood or wt loss. Pt continues to have recurring LBP without change in severity, bowel or bladder change, fever, wt loss,  worsening LE pain/numbness/weakness, gait change or falls.  Past Medical History  Diagnosis Date  . SINUSITIS- ACUTE-NOS 12/22/2009  . ALLERGIC RHINITIS 10/18/2008  . SHOULDER PAIN, LEFT 10/18/2010  . WRIST PAIN, RIGHT 10/18/2008  . KNEE PAIN, RIGHT 10/18/2008  . LOW BACK PAIN 10/18/2008  . LUMBAR RADICULOPATHY, RIGHT 06/28/2009  . BACK PAIN, UPPER 10/18/2008  . BACK PAIN, UPPER 10/18/2008  . UNSPECIFIED NEURALGIA NEURITIS AND RADICULITIS 10/18/2008  . INTERMITTENT VERTIGO 11/01/2009  . Dysuria 10/18/2010  . GERD (gastroesophageal reflux disease) 06/20/2011   Past Surgical History  Procedure Date  . Tonsillectomy   . Abdominal hysterectomy 05/2005    due to fibroids, still born, ovaries intact    reports that she has  never smoked. She does not have any smokeless tobacco history on file. She reports that she does not drink alcohol or use illicit drugs. family history includes Arthritis in her father and mother; Colon cancer in her father and maternal aunt; Dementia in her father; Diabetes in her father; Hypertension in her brother, daughter, and mother; Kidney disease in her mother; and Parkinsonism in her father. Allergies  Allergen Reactions  . Sulfamethoxazole W/Trimethoprim     REACTION: Faint   Current Outpatient Prescriptions on File Prior to Visit  Medication Sig Dispense Refill  . cyclobenzaprine (FLEXERIL) 5 MG tablet Take 1 tablet (5 mg total) by mouth 3 (three) times daily as needed for muscle spasms.  60 tablet  1  . HYDROcodone-acetaminophen (NORCO) 5-325 MG per tablet Take 1 tablet by mouth every 6 (six) hours as needed for pain.  30 tablet  0  . levocetirizine (XYZAL) 5 MG tablet Take 1 tablet (5 mg total) by mouth every evening.  90 tablet  3  . triamcinolone (KENALOG) 0.1 % cream Apply topically 2 (two) times daily.  30 g  0  . mometasone (NASONEX) 50 MCG/ACT nasal spray 2 sprays by Nasal route daily.  17 g  2   Review of Systems Review of Systems  Constitutional: Negative for diaphoresis and unexpected weight change.  HENT: Negative for drooling and tinnitus.   Eyes: Negative for photophobia and visual disturbance.  Respiratory: Negative for choking and stridor.   Gastrointestinal: Negative for vomiting and blood in stool.  Genitourinary: Negative for  hematuria and decreased urine volume.      Objective:   Physical Exam BP 108/70  Pulse 88  Temp(Src) 98.5 F (36.9 C) (Oral)  Ht 5\' 4"  (1.626 m)  Wt 235 lb 4 oz (106.709 kg)  BMI 40.38 kg/m2  SpO2 97% Physical Exam  VS noted Constitutional: Pt appears well-developed and well-nourished.  HENT: Head: Normocephalic.  Right Ear: External ear normal.  Left Ear: External ear normal.  Eyes: Conjunctivae and EOM are normal. Pupils  are equal, round, and reactive to light.  Neck: Normal range of motion. Neck supple.  Cardiovascular: Normal rate and regular rhythm.   Pulmonary/Chest: Effort normal and breath sounds normal.  Abd:  Soft, NT, non-distended, + BS, no HSM Neurological: Pt is alert. No cranial nerve deficit. LE motor/sens/dtr intact Skin: Skin is warm. No erythema.  Psychiatric: Pt behavior is normal. Thought content normal. 1-2+ nervous Tender left rib costal margin mild noted, to left ant chest all, lateral chest and left flank area       Assessment & Plan:

## 2011-06-20 NOTE — Assessment & Plan Note (Signed)
To change to generic protonix,  to f/u any worsening symptoms or concerns

## 2011-06-20 NOTE — Progress Notes (Signed)
Quick Note:  Voice message left on [employer] system - lab is negative, normal or otherwise stable, pt to continue same tx ______ 

## 2011-06-20 NOTE — Assessment & Plan Note (Signed)
New candidal type rash to the areas below breasts, groin - for nystatin powder prn

## 2011-06-20 NOTE — Assessment & Plan Note (Signed)
C/w costal margin/rib soreness//tender - has pain medication, declines cxr, will follow

## 2011-06-20 NOTE — Assessment & Plan Note (Signed)
stable overall by hx and exam, and pt to continue medical treatment as before 

## 2011-06-20 NOTE — Patient Instructions (Addendum)
Take all new medications as prescribed - the nystatin powder, and the generic for protonix for stomach acid Continue all other medications as before - including the flonase, and pain medication Your urine will be sent for analysis Please call the phone number [phone] (the [employer] System) for results of testing in 2-3 days;  When calling, simply dial the number, and when prompted enter the MRN number above (the Medical Record Number) and the # key, then the message should start.

## 2011-11-05 ENCOUNTER — Ambulatory Visit: Payer: PRIVATE HEALTH INSURANCE | Admitting: Internal Medicine

## 2012-02-15 ENCOUNTER — Telehealth: Payer: Self-pay

## 2012-08-16 ENCOUNTER — Emergency Department (INDEPENDENT_AMBULATORY_CARE_PROVIDER_SITE_OTHER)
Admission: EM | Admit: 2012-08-16 | Discharge: 2012-08-16 | Disposition: A | Discharge status: Home / Self Care | Payer: Self-pay | Source: Home / Self Care | Attending: Emergency Medicine | Admitting: Emergency Medicine

## 2012-08-16 ENCOUNTER — Encounter (HOSPITAL_COMMUNITY): Payer: Self-pay | Admitting: Emergency Medicine

## 2012-08-16 DIAGNOSIS — J309 Allergic rhinitis, unspecified: Secondary | ICD-10-CM

## 2012-08-16 DIAGNOSIS — K219 Gastro-esophageal reflux disease without esophagitis: Secondary | ICD-10-CM

## 2012-08-16 HISTORY — DX: Other seasonal allergic rhinitis: J30.2

## 2012-08-16 MED ORDER — KETOTIFEN FUMARATE 0.025 % OP SOLN: 1.0000 [drp] | Freq: Two times a day (BID) | OPHTHALMIC | Status: AC

## 2012-08-16 MED ORDER — FLUTICASONE PROPIONATE 50 MCG/ACT NA SUSP: 2.0000 | Freq: Every day | NASAL | Status: DC

## 2012-08-16 MED ORDER — LEVOCETIRIZINE DIHYDROCHLORIDE 5 MG PO TABS: 5.0000 mg | ORAL_TABLET | Freq: Every evening | ORAL | Status: DC

## 2012-08-16 MED ORDER — PANTOPRAZOLE SODIUM 40 MG PO TBEC: 40.0000 mg | DELAYED_RELEASE_TABLET | Freq: Every day | ORAL | Status: DC

## 2012-08-16 NOTE — ED Notes (Signed)
Pt c/o poss sinus inf x1 week w/sx that include: sore throat, left ear pain, sore on the left eye and feeling nauseas... States she has hx of sinus infections... Denies fever, vomiting, and diarrhea.

## 2012-08-16 NOTE — ED Provider Notes (Signed)
History     CSN: [id number]  Arrival date & time 08/16/12  1736   First MD Initiated Contact with Patient 08/16/12 1746      Chief Complaint  Patient presents with  . Recurrent Sinusitis    (Consider location/radiation/quality/duration/timing/severity/associated sxs/prior treatment) HPI Comments: Patient of nasal congestion, sneezing, itchy watery eyes, frontal, maxillary sinus pain/pressure, left ear pain, sore throat, postnasal drip for the past week. No particular aggravating or alleviating factors. She is trying some cetirizine without improvement.  States that she is unable to afford her usual allergy medications. No nausea, vomiting, fevers, other headache. No purulent nasal drainage, dental pain. No trismus, voice changes, drooling, difficulty breathing, difficulty swallowing. History seasonal allergies and sinus infections. Also with history of reflux, never filled protonix  ROS as noted in HPI. All other ROS negative.   Patient is a 51 y.o. female presenting with URI. The history is provided by the patient. No language interpreter was used.  URI The primary symptoms include ear pain, sore throat and cough. Primary symptoms do not include fever, swollen glands, wheezing, abdominal pain, nausea, vomiting, myalgias or rash. This is a new problem. The problem has not changed since onset. Symptoms associated with the illness include plugged ear sensation, sinus pressure, congestion and rhinorrhea. The illness is not associated with chills or facial pain.    Past Medical History  Diagnosis Date  . SINUSITIS- ACUTE-NOS 12/22/2009  . ALLERGIC RHINITIS 10/18/2008  . SHOULDER PAIN, LEFT 10/18/2010  . WRIST PAIN, RIGHT 10/18/2008  . KNEE PAIN, RIGHT 10/18/2008  . LOW BACK PAIN 10/18/2008  . LUMBAR RADICULOPATHY, RIGHT 06/28/2009  . BACK PAIN, UPPER 10/18/2008  . BACK PAIN, UPPER 10/18/2008  . UNSPECIFIED NEURALGIA NEURITIS AND RADICULITIS 10/18/2008  . INTERMITTENT VERTIGO 11/01/2009  .  Dysuria 10/18/2010  . GERD (gastroesophageal reflux disease) 06/20/2011  . Seasonal allergies     Past Surgical History  Procedure Date  . Tonsillectomy   . Abdominal hysterectomy 05/2005    due to fibroids, still born, ovaries intact    Family History  Problem Relation Age of Onset  . Arthritis Mother   . Kidney disease Mother   . Hypertension Mother   . Arthritis Father   . Diabetes Father   . Parkinsonism Father   . Dementia Father   . Colon cancer Father   . Hypertension Brother   . Hypertension Daughter   . Colon cancer Maternal Aunt     History  Substance Use Topics  . Smoking status: Never Smoker   . Smokeless tobacco: Not on file  . Alcohol Use: No    OB History    Grav Para Term Preterm Abortions TAB SAB Ect Mult Living                  Review of Systems  Constitutional: Negative for fever and chills.  HENT: Positive for ear pain, congestion, sore throat, rhinorrhea and sinus pressure.   Respiratory: Positive for cough. Negative for wheezing.   Gastrointestinal: Negative for nausea, vomiting and abdominal pain.  Musculoskeletal: Negative for myalgias.  Skin: Negative for rash.    Allergies  Sulfamethoxazole w-trimethoprim  Home Medications   Current Outpatient Rx  Name Route Sig Dispense Refill  . FLUTICASONE PROPIONATE 50 MCG/ACT NA SUSP Nasal Place 2 sprays into the nose daily. 16 g 0  . KETOTIFEN FUMARATE 0.025 % OP SOLN Both Eyes Place 1 drop into both eyes 2 (two) times daily. 5 mL 0  . LEVOCETIRIZINE DIHYDROCHLORIDE 5  MG PO TABS Oral Take 1 tablet (5 mg total) by mouth every evening. 90 tablet 3  . PANTOPRAZOLE SODIUM 40 MG PO TBEC Oral Take 1 tablet (40 mg total) by mouth daily. 30 tablet 0    BP 136/83  Pulse 77  Temp 97.9 F (36.6 C) (Oral)  Resp 14  SpO2 100%  Physical Exam  Nursing note and vitals reviewed. Constitutional: She appears well-developed and well-nourished.  HENT:  Head: Normocephalic and atraumatic.  Right Ear:  Tympanic membrane and ear canal normal.  Left Ear: Tympanic membrane and ear canal normal.  Nose: Mucosal edema and rhinorrhea present. No epistaxis.  Mouth/Throat: Uvula is midline and mucous membranes are normal. Posterior oropharyngeal erythema present. No oropharyngeal exudate.       Pale boggy nasal mucosa. (-) frontal, maxillary sinus tenderness  Eyes: Conjunctivae and EOM are normal.       Allergic shiners  Neck: Normal range of motion. Neck supple.  Cardiovascular: Normal rate.   Pulmonary/Chest: Effort normal and breath sounds normal.  Abdominal: She exhibits no distension.  Musculoskeletal: Normal range of motion.  Lymphadenopathy:    She has no cervical adenopathy.  Neurological: She is alert. Coordination normal.  Skin: Skin is warm and dry. No rash noted.  Psychiatric: She has a normal mood and affect. Her behavior is normal. Judgment and thought content normal.    ED Course  Procedures (including critical care time)  Labs Reviewed - No data to display No results found.   1. Allergic rhinitis   2. Allergic sinusitis   3. GERD (gastroesophageal reflux disease)     MDM  Patient states that xyzal works best, gave her a coupon to help with the cost. We'll also restart her potonix, ketotifen eyedrops. States is unable to afford the Nasonex, will have her restart Flonase. No evidence of bacterial infection at this time. Symptoms for less than 10 days, no history of fevers. No indications for antibiotics at this time. Will have her start saline nasal irrigation, have her followup with her primary care physician. Discussed signs and symptoms that should prompt return to the department. Patient agrees with plan.  Luiz Blare, MD 08/17/12 1007

## 2012-08-23 ENCOUNTER — Emergency Department (HOSPITAL_COMMUNITY)
Admission: EM | Admit: 2012-08-23 | Discharge: 2012-08-23 | Disposition: A | Discharge status: Home / Self Care | Payer: Self-pay | Source: Home / Self Care

## 2012-08-23 ENCOUNTER — Encounter (HOSPITAL_COMMUNITY): Payer: Self-pay | Admitting: Emergency Medicine

## 2012-08-23 DIAGNOSIS — K219 Gastro-esophageal reflux disease without esophagitis: Secondary | ICD-10-CM

## 2012-08-23 NOTE — ED Notes (Signed)
Pt c/o poss GERD... Was seen here last week for a sinus infection but has not been able to p/u Rx's due to financial situation... Also hx of GERD but has not been able to p/u rx as well.... Started having: burning sensation at abd w/sour/metalic taste... Has had the urge to "burp" but it's very dificult and when she does burp, there is some relief.... Would like to have an ekg.

## 2012-08-23 NOTE — ED Provider Notes (Signed)
History     CSN: [id number]  Arrival date & time 08/23/12  1736   None     Chief Complaint  Patient presents with  . Gastrophageal Reflux    (Consider location/radiation/quality/duration/timing/severity/associated sxs/prior treatment) The history is provided by the patient.  pt reports burning in center of chest with metallic taste on her tongue since yesterday.  Fullness in middle of chest today after eating large meal. Have not taken any medication for same.  Last night mylanta taken for symptoms with some relief.  Hx of gerd, currently not on any medications.  Prescribed medications with last visit to urgent care but reports she will not be able to afford medication until the end of this week.    Past Medical History  Diagnosis Date  . SINUSITIS- ACUTE-NOS 12/22/2009  . ALLERGIC RHINITIS 10/18/2008  . SHOULDER PAIN, LEFT 10/18/2010  . WRIST PAIN, RIGHT 10/18/2008  . KNEE PAIN, RIGHT 10/18/2008  . LOW BACK PAIN 10/18/2008  . LUMBAR RADICULOPATHY, RIGHT 06/28/2009  . BACK PAIN, UPPER 10/18/2008  . BACK PAIN, UPPER 10/18/2008  . UNSPECIFIED NEURALGIA NEURITIS AND RADICULITIS 10/18/2008  . INTERMITTENT VERTIGO 11/01/2009  . Dysuria 10/18/2010  . GERD (gastroesophageal reflux disease) 06/20/2011  . Seasonal allergies     Past Surgical History  Procedure Date  . Tonsillectomy   . Abdominal hysterectomy 05/2005    due to fibroids, still born, ovaries intact    Family History  Problem Relation Age of Onset  . Arthritis Mother   . Kidney disease Mother   . Hypertension Mother   . Arthritis Father   . Diabetes Father   . Parkinsonism Father   . Dementia Father   . Colon cancer Father   . Hypertension Brother   . Hypertension Daughter   . Colon cancer Maternal Aunt     History  Substance Use Topics  . Smoking status: Never Smoker   . Smokeless tobacco: Not on file  . Alcohol Use: No    OB History    Grav Para Term Preterm Abortions TAB SAB Ect Mult Living                  Review of Systems  Constitutional: Negative.   Respiratory: Negative.   Cardiovascular: Negative.   Gastrointestinal: Positive for abdominal pain. Negative for nausea, vomiting, diarrhea, constipation, blood in stool and abdominal distention.  Genitourinary: Negative.     Allergies  Sulfamethoxazole w-trimethoprim  Home Medications   Current Outpatient Rx  Name Route Sig Dispense Refill  . ZYRTEC ALLERGY PO Oral Take by mouth.    Marland Kitchen FLUTICASONE PROPIONATE 50 MCG/ACT NA SUSP Nasal Place 2 sprays into the nose daily. 16 g 0  . KETOTIFEN FUMARATE 0.025 % OP SOLN Both Eyes Place 1 drop into both eyes 2 (two) times daily. 5 mL 0  . LEVOCETIRIZINE DIHYDROCHLORIDE 5 MG PO TABS Oral Take 1 tablet (5 mg total) by mouth every evening. 90 tablet 3  . PANTOPRAZOLE SODIUM 40 MG PO TBEC Oral Take 1 tablet (40 mg total) by mouth daily. 30 tablet 0    BP 131/75  Pulse 80  Temp 98.4 F (36.9 C) (Oral)  Resp 18  SpO2 99%  Physical Exam  Nursing note and vitals reviewed. Constitutional: She is oriented to person, place, and time. Vital signs are normal. She appears well-developed and well-nourished. She is active and cooperative.  HENT:  Head: Normocephalic.  Mouth/Throat: Oropharynx is clear and moist. No oropharyngeal exudate.  Eyes: Conjunctivae normal  are normal. Pupils are equal, round, and reactive to light. No scleral icterus.  Neck: Trachea normal and normal range of motion. Neck supple. No thyromegaly present.  Cardiovascular: Normal rate, regular rhythm and normal heart sounds.   No murmur heard. Pulmonary/Chest: Effort normal and breath sounds normal.  Abdominal: Soft. Bowel sounds are normal. There is no tenderness. There is no rebound and no guarding.  Lymphadenopathy:    She has no cervical adenopathy.  Neurological: She is alert and oriented to person, place, and time. No cranial nerve deficit or sensory deficit.  Skin: Skin is warm and dry.  Psychiatric: She has a  normal mood and affect. Her speech is normal and behavior is normal. Judgment and thought content normal. Cognition and memory are normal.    ED Course  Procedures (including critical care time)  Labs Reviewed - No data to display No results found.   1. GERD (gastroesophageal reflux disease)       MDM  Begin OTC Omeprazole as an alternate until you can afford Protonix as previously prescribed.  Literature provided for GERD home interventions to assist with symptoms.          Johnsie Kindred, NP 08/24/12 [phone]

## 2012-08-24 NOTE — ED Provider Notes (Signed)
Medical screening examination/treatment/procedure(s) were performed by non-physician practitioner and as supervising physician I was immediately available for consultation/collaboration.  Raynald Blend, MD 08/24/12 [phone]

## 2012-09-25 ENCOUNTER — Telehealth: Payer: Self-pay

## 2012-09-25 DIAGNOSIS — Z0000 Encounter for general adult medical examination without abnormal findings: Secondary | ICD-10-CM

## 2012-09-25 NOTE — Telephone Encounter (Signed)
Put order in for labs. 

## 2012-09-26 ENCOUNTER — Encounter: Payer: PRIVATE HEALTH INSURANCE | Admitting: Internal Medicine

## 2012-10-03 ENCOUNTER — Other Ambulatory Visit (INDEPENDENT_AMBULATORY_CARE_PROVIDER_SITE_OTHER): Payer: PRIVATE HEALTH INSURANCE

## 2012-10-03 DIAGNOSIS — Z0000 Encounter for general adult medical examination without abnormal findings: Secondary | ICD-10-CM

## 2012-10-03 LAB — CBC WITH DIFFERENTIAL/PLATELET
Basophils Absolute: 0 10*3/uL (ref 0.0–0.1)
Basophils Relative: 0.9 % (ref 0.0–3.0)
Eosinophils Absolute: 0.2 10*3/uL (ref 0.0–0.7)
Eosinophils Relative: 3.5 % (ref 0.0–5.0)
HCT: 38 % (ref 36.0–46.0)
Hemoglobin: 12.5 g/dL (ref 12.0–15.0)
Lymphocytes Relative: 37.1 % (ref 12.0–46.0)
Lymphs Abs: 1.7 10*3/uL (ref 0.7–4.0)
MCHC: 33 g/dL (ref 30.0–36.0)
MCV: 92 fl (ref 78.0–100.0)
Monocytes Absolute: 0.3 10*3/uL (ref 0.1–1.0)
Monocytes Relative: 6.5 % (ref 3.0–12.0)
Neutro Abs: 2.4 10*3/uL (ref 1.4–7.7)
Neutrophils Relative %: 52 % (ref 43.0–77.0)
Platelets: 245 10*3/uL (ref 150.0–400.0)
RBC: 4.13 Mil/uL (ref 3.87–5.11)
RDW: 13.3 % (ref 11.5–14.6)
WBC: 4.6 10*3/uL (ref 4.5–10.5)

## 2012-10-03 LAB — URINALYSIS, ROUTINE W REFLEX MICROSCOPIC
Bilirubin Urine: NEGATIVE
Ketones, ur: NEGATIVE
Leukocytes, UA: NEGATIVE
Nitrite: NEGATIVE
Specific Gravity, Urine: 1.01 (ref 1.000–1.030)
Total Protein, Urine: NEGATIVE
Urine Glucose: NEGATIVE
Urobilinogen, UA: 0.2 (ref 0.0–1.0)
pH: 6.5 (ref 5.0–8.0)

## 2012-10-03 LAB — HEPATIC FUNCTION PANEL
ALT: 25 U/L (ref 0–35)
AST: 24 U/L (ref 0–37)
Albumin: 3.8 g/dL (ref 3.5–5.2)
Alkaline Phosphatase: 69 U/L (ref 39–117)
Bilirubin, Direct: 0.1 mg/dL (ref 0.0–0.3)
Total Bilirubin: 0.5 mg/dL (ref 0.3–1.2)
Total Protein: 8.2 g/dL (ref 6.0–8.3)

## 2012-10-03 LAB — BASIC METABOLIC PANEL
BUN: 9 mg/dL (ref 6–23)
CO2: 28 mEq/L (ref 19–32)
Calcium: 9.4 mg/dL (ref 8.4–10.5)
Chloride: 105 mEq/L (ref 96–112)
Creatinine, Ser: 0.8 mg/dL (ref 0.4–1.2)
GFR: 96.68 mL/min (ref >60.00)
Glucose, Bld: 95 mg/dL (ref 70–99)
Potassium: 4.1 mEq/L (ref 3.5–5.1)
Sodium: 141 mEq/L (ref 135–145)

## 2012-10-03 LAB — LIPID PANEL
Cholesterol: 166 mg/dL (ref 0–200)
HDL: 56.7 mg/dL (ref >39.00)
LDL Cholesterol: 102 mg/dL — ABNORMAL HIGH (ref 0–99)
Total CHOL/HDL Ratio: 3
Triglycerides: 39 mg/dL (ref 0.0–149.0)
VLDL: 7.8 mg/dL (ref 0.0–40.0)

## 2012-10-03 LAB — TSH: TSH: 1.3 u[IU]/mL (ref 0.35–5.50)

## 2012-10-09 ENCOUNTER — Ambulatory Visit (INDEPENDENT_AMBULATORY_CARE_PROVIDER_SITE_OTHER): Payer: Self-pay | Admitting: Internal Medicine

## 2012-10-09 ENCOUNTER — Encounter: Payer: Self-pay | Admitting: Internal Medicine

## 2012-10-09 VITALS — BP 120/80 | HR 71 | Temp 97.8°F | Ht 64.0 in | Wt 225.5 lb

## 2012-10-09 DIAGNOSIS — K219 Gastro-esophageal reflux disease without esophagitis: Secondary | ICD-10-CM

## 2012-10-09 DIAGNOSIS — Z23 Encounter for immunization: Secondary | ICD-10-CM

## 2012-10-09 DIAGNOSIS — Z0000 Encounter for general adult medical examination without abnormal findings: Secondary | ICD-10-CM

## 2012-10-09 DIAGNOSIS — J309 Allergic rhinitis, unspecified: Secondary | ICD-10-CM

## 2012-10-09 MED ORDER — LEVOCETIRIZINE DIHYDROCHLORIDE 5 MG PO TABS: 5.0000 mg | ORAL_TABLET | Freq: Every evening | ORAL | Status: DC

## 2012-10-09 MED ORDER — PANTOPRAZOLE SODIUM 40 MG PO TBEC: 40.0000 mg | DELAYED_RELEASE_TABLET | Freq: Every day | ORAL | Status: DC

## 2012-10-09 MED ORDER — FLUTICASONE PROPIONATE 50 MCG/ACT NA SUSP: 2.0000 | Freq: Every day | NASAL | Status: DC

## 2012-10-09 MED ORDER — MAGIC MOUTHWASH: 5.0000 mL | Freq: Four times a day (QID) | ORAL | Status: DC | PRN

## 2012-10-09 MED ORDER — CYCLOBENZAPRINE HCL 5 MG PO TABS: 5.0000 mg | ORAL_TABLET | Freq: Three times a day (TID) | ORAL | Status: DC | PRN

## 2012-10-09 NOTE — Patient Instructions (Addendum)
Take all new medications as prescribed Continue all other medications as before You also have the nasonex samples today You are given the shingles shot vaccine prescription today, which is about $180 at [facility] You are given the EKG and lab results today Please remember to followup with your GYN for the yearly pap smear and/or mammogram Please continue your efforts at being more active, low cholesterol diet, and weight control. You are otherwise up to date with prevention Please return in 1 year for your yearly visit, or sooner if needed, with Lab testing done 3-5 days before

## 2012-10-09 NOTE — Progress Notes (Signed)
Subjective:    Patient ID: Melody Lewis, female    DOB: July 04, 1961, 51 y.o.   MRN: [id number]  HPI Here for wellness and f/u;  Overall doing ok;  Pt denies CP, worsening SOB, DOE, wheezing, orthopnea, PND, worsening LE edema, palpitations, dizziness or syncope.  Pt denies neurological change such as new Headache, facial or extremity weakness.  Pt denies polydipsia, polyuria, or low sugar symptoms. Pt states overall good compliance with treatment and medications, good tolerability, and trying to follow lower cholesterol diet.  Pt denies worsening depressive symptoms, suicidal ideation or panic. No fever, wt loss, night sweats, loss of appetite, or other constitutional symptoms.  Pt states good ability with ADL's, low fall risk, home safety reviewed and adequate, no significant changes in hearing or vision, and occasionally active with exercise.  Pt continues to have recurring LBP without change in severity, bowel or bladder change, fever, wt loss,  worsening LE pain/numbness/weakness, gait change or falls, asks for muscle relaxer. Sister and aunt with recent shingles. Needs refill protonix. Due for f/u colonoscopy 2015 with Dr Marina Goodell.  Now working on trying to back on regular health checks, plans to see GYN for pap and mammgram after Dec 10 2012 under obamacare.  Does have several wks ongoing nasal allergy symptoms with clear congestion, itch and sneeze, without fever, pain, ST, cough or wheezing.  Mentions oil of oregano forom the mediterranean that is supposed to be good for fungal and toxins, and seems to help her post nasal gtt as well.  Needs better "drip control", still takes her xyzal.  Also mentions she is still concerned about the "bumps on the very back of my tongue" and yellowish coating to tongue.  Needs flonase refill.   / Past Medical History  Diagnosis Date  . SINUSITIS- ACUTE-NOS 12/22/2009  . ALLERGIC RHINITIS 10/18/2008  . SHOULDER PAIN, LEFT 10/18/2010  . WRIST PAIN, RIGHT 10/18/2008  .  KNEE PAIN, RIGHT 10/18/2008  . LOW BACK PAIN 10/18/2008  . LUMBAR RADICULOPATHY, RIGHT 06/28/2009  . BACK PAIN, UPPER 10/18/2008  . BACK PAIN, UPPER 10/18/2008  . UNSPECIFIED NEURALGIA NEURITIS AND RADICULITIS 10/18/2008  . INTERMITTENT VERTIGO 11/01/2009  . Dysuria 10/18/2010  . GERD (gastroesophageal reflux disease) 06/20/2011  . Seasonal allergies    Past Surgical History  Procedure Date  . Tonsillectomy   . Abdominal hysterectomy 05/2005    due to fibroids, still born, ovaries intact    reports that she has never smoked. She does not have any smokeless tobacco history on file. She reports that she does not drink alcohol or use illicit drugs. family history includes Arthritis in her father and mother; Colon cancer in her father and maternal aunt; Dementia in her father; Diabetes in her father; Hypertension in her brother, daughter, and mother; Kidney disease in her mother; and Parkinsonism in her father. Allergies  Allergen Reactions  . Sulfamethoxazole W-Trimethoprim     REACTION: Faint   Current Outpatient Prescriptions on File Prior to Visit  Medication Sig Dispense Refill  . Cetirizine HCl (ZYRTEC ALLERGY PO) Take by mouth.      . pantoprazole (PROTONIX) 40 MG tablet Take 1 tablet (40 mg total) by mouth daily.  30 tablet  0  . fluticasone (FLONASE) 50 MCG/ACT nasal spray Place 2 sprays into the nose daily.  16 g  0  . levocetirizine (XYZAL) 5 MG tablet Take 1 tablet (5 mg total) by mouth every evening.  90 tablet  3  . DISCONTD: mometasone (NASONEX) 50 MCG/ACT  nasal spray 2 sprays by Nasal route daily.  17 g  2   Review of Systems Review of Systems  Constitutional: Negative for diaphoresis, activity change, appetite change and unexpected weight change.  HENT: Negative for hearing loss, ear pain, facial swelling, mouth sores and neck stiffness.   Eyes: Negative for pain, redness and visual disturbance.  Respiratory: Negative for shortness of breath and wheezing.   Cardiovascular:  Negative for chest pain and palpitations.  Gastrointestinal: Negative for diarrhea, blood in stool, abdominal distention and rectal pain.  Genitourinary: Negative for hematuria, flank pain and decreased urine volume.  Musculoskeletal: Negative for myalgias and joint swelling.  Skin: Negative for color change and wound.  Neurological: Negative for syncope and numbness.  Hematological: Negative for adenopathy.  Psychiatric/Behavioral: Negative for hallucinations, self-injury, decreased concentration and agitation.      Objective:   Physical Exam BP 120/80  Pulse 71  Temp 97.8 F (36.6 C) (Oral)  Ht 5\' 4"  (1.626 m)  Wt 225 lb 8 oz (102.286 kg)  BMI 38.71 kg/m2  SpO2 97% Physical Exam  VS noted Constitutional: Pt is oriented to person, place, and time. Appears well-developed and well-nourished.  HENT:  Head: Normocephalic and atraumatic.  Right Ear: External ear normal.  Left Ear: External ear normal.  Nose: Nose normal.  Mouth/Throat: Oropharynx is clear and moist.  Tongue without rash or mass or other abnormal Eyes: Conjunctivae and EOM are normal. Pupils are equal, round, and reactive to light.  Neck: Normal range of motion. Neck supple. No JVD present. No tracheal deviation present.  Cardiovascular: Normal rate, regular rhythm, normal heart sounds and intact distal pulses.   Pulmonary/Chest: Effort normal and breath sounds normal.  Abdominal: Soft. Bowel sounds are normal. There is no tenderness.  Musculoskeletal: Normal range of motion. Exhibits no edema.  Lymphadenopathy:  Has no cervical adenopathy.  Neurological: Pt is alert and oriented to person, place, and time. Pt has normal reflexes. No cranial nerve deficit.  Skin: Skin is warm and dry. No rash noted.  Psychiatric:  Has  normal mood and affect. Behavior is normal.     Assessment & Plan:

## 2012-10-11 ENCOUNTER — Encounter: Payer: Self-pay | Admitting: Internal Medicine

## 2012-10-11 NOTE — Assessment & Plan Note (Signed)
Overall doing well, age appropriate education and counseling updated, referrals for preventative services and immunizations addressed, dietary and smoking counseling addressed, most recent labs and ECG reviewed.  I have personally reviewed and have noted: 1) the patient's medical and social history 2) The pt's use of alcohol, tobacco, and illicit drugs 3) The patient's current medications and supplements 4) Functional ability including ADL's, fall risk, home safety risk, hearing and visual impairment 5) Diet and physical activities 6) Evidence for depression or mood disorder 7) The patient's height, weight, and BMI have been recorded in the chart I have made referrals, and provided counseling and education based on review of the above Gave rx for zostavax per pt request

## 2012-10-11 NOTE — Assessment & Plan Note (Signed)
stable overall by hx and exam, and pt to continue medical treatment as before 

## 2012-10-11 NOTE — Assessment & Plan Note (Signed)
To add flonase/xyzal asd

## 2013-03-03 ENCOUNTER — Ambulatory Visit (INDEPENDENT_AMBULATORY_CARE_PROVIDER_SITE_OTHER): Payer: Self-pay | Admitting: Internal Medicine

## 2013-03-03 ENCOUNTER — Encounter: Payer: Self-pay | Admitting: Internal Medicine

## 2013-03-03 ENCOUNTER — Other Ambulatory Visit: Payer: Self-pay

## 2013-03-03 VITALS — BP 120/76 | HR 75 | Temp 97.9°F | Ht 64.0 in | Wt 234.5 lb

## 2013-03-03 DIAGNOSIS — N951 Menopausal and female climacteric states: Secondary | ICD-10-CM | POA: Insufficient documentation

## 2013-03-03 DIAGNOSIS — R350 Frequency of micturition: Secondary | ICD-10-CM | POA: Insufficient documentation

## 2013-03-03 DIAGNOSIS — N393 Stress incontinence (female) (male): Secondary | ICD-10-CM | POA: Insufficient documentation

## 2013-03-03 DIAGNOSIS — R3915 Urgency of urination: Secondary | ICD-10-CM

## 2013-03-03 LAB — POCT URINALYSIS DIPSTICK
Bilirubin, UA: NEGATIVE
Glucose, UA: NEGATIVE
Ketones, UA: NEGATIVE
Leukocytes, UA: NEGATIVE
Nitrite, UA: NEGATIVE
Protein, UA: NEGATIVE
Spec Grav, UA: 1.01
Urobilinogen, UA: NEGATIVE
pH, UA: 5

## 2013-03-03 MED ORDER — ESTRADIOL 0.1 MG/GM VA CREA: 2.0000 g | TOPICAL_CREAM | Freq: Every day | VAGINAL | Status: DC

## 2013-03-03 MED ORDER — CEPHALEXIN 500 MG PO CAPS: 500.0000 mg | ORAL_CAPSULE | Freq: Four times a day (QID) | ORAL | Status: DC

## 2013-03-03 NOTE — Assessment & Plan Note (Signed)
Ok for estradiol cream asd,  to f/u any worsening symptoms or concerns

## 2013-03-03 NOTE — Assessment & Plan Note (Signed)
Mild, explained kegal excercises, consider nortryptilene or similar, may improve with vaginal estrogen,  to f/u any worsening symptoms or concerns

## 2013-03-03 NOTE — Assessment & Plan Note (Signed)
prob cystitis, for cephalexin course, and urine cx

## 2013-03-03 NOTE — Progress Notes (Signed)
Subjective:    Patient ID: Melody Lewis, female    DOB: Nov 12, 1961, 52 y.o.   MRN: [id number]  HPI  Here with 2-3 days onset urinary frequency and low abd discomfort, but Denies frank dysuria, flank pain, hematuria or n/v, fever, chills.  Also mentions vaginal dryness now postmenopausal, has occas night sweats but little to no daytime hot flushes, s/p TAH, some minor dyspareunia better with K-Y, and mild leakage with stress such as cough and sneeze for the past yr.  No other recent UTI.   Past Medical History  Diagnosis Date  . SINUSITIS- ACUTE-NOS 12/22/2009  . ALLERGIC RHINITIS 10/18/2008  . SHOULDER PAIN, LEFT 10/18/2010  . WRIST PAIN, RIGHT 10/18/2008  . KNEE PAIN, RIGHT 10/18/2008  . LOW BACK PAIN 10/18/2008  . LUMBAR RADICULOPATHY, RIGHT 06/28/2009  . BACK PAIN, UPPER 10/18/2008  . BACK PAIN, UPPER 10/18/2008  . UNSPECIFIED NEURALGIA NEURITIS AND RADICULITIS 10/18/2008  . INTERMITTENT VERTIGO 11/01/2009  . Dysuria 10/18/2010  . GERD (gastroesophageal reflux disease) 06/20/2011  . Seasonal allergies    Past Surgical History  Procedure Laterality Date  . Tonsillectomy    . Abdominal hysterectomy  05/2005    due to fibroids, still born, ovaries intact    reports that she has never smoked. She does not have any smokeless tobacco history on file. She reports that she does not drink alcohol or use illicit drugs. family history includes Arthritis in her father and mother; Colon cancer in her father and maternal aunt; Dementia in her father; Diabetes in her father; Hypertension in her brother, daughter, and mother; Kidney disease in her mother; and Parkinsonism in her father. Allergies  Allergen Reactions  . Sulfamethoxazole W-Trimethoprim     REACTION: Faint   Current Outpatient Prescriptions on File Prior to Visit  Medication Sig Dispense Refill  . Alum & Mag Hydroxide-Simeth (MAGIC MOUTHWASH) SOLN Take 5 mLs by mouth 4 (four) times daily as needed.  60 mL  1  . fluticasone (FLONASE) 50  MCG/ACT nasal spray Place 2 sprays into the nose daily.  16 g  5  . levocetirizine (XYZAL) 5 MG tablet Take 1 tablet (5 mg total) by mouth every evening.  90 tablet  3  . pantoprazole (PROTONIX) 40 MG tablet Take 1 tablet (40 mg total) by mouth daily.  30 tablet  11  . cyclobenzaprine (FLEXERIL) 5 MG tablet Take 1 tablet (5 mg total) by mouth 3 (three) times daily as needed for muscle spasms.  60 tablet  2  . [DISCONTINUED] mometasone (NASONEX) 50 MCG/ACT nasal spray 2 sprays by Nasal route daily.  17 g  2   No current facility-administered medications on file prior to visit.   Review of Systems  Constitutional: Negative for unexpected weight change, or unusual diaphoresis  HENT: Negative for tinnitus.   Eyes: Negative for photophobia and visual disturbance.  Respiratory: Negative for choking and stridor.   Gastrointestinal: Negative for vomiting and blood in stool.  Genitourinary: Negative for hematuria and decreased urine volume.  Musculoskeletal: Negative for acute joint swelling Skin: Negative for color change and wound.  Neurological: Negative for tremors and numbness other than noted  Psychiatric/Behavioral: Negative for decreased concentration or  hyperactivity.       Objective:   Physical Exam BP 120/76  Pulse 75  Temp(Src) 97.9 F (36.6 C) (Oral)  Ht 5\' 4"  (1.626 m)  Wt 234 lb 8 oz (106.369 kg)  BMI 40.23 kg/m2  SpO2 99% VS noted,  Constitutional: Pt appears well-developed  and well-nourished.  HENT: Head: NCAT.  Right Ear: External ear normal.  Left Ear: External ear normal.  Eyes: Conjunctivae and EOM are normal. Pupils are equal, round, and reactive to light.  Neck: Normal range of motion. Neck supple.  Cardiovascular: Normal rate and regular rhythm.   Pulmonary/Chest: Effort normal and breath sounds normal.  Abd:  Soft, non-distended, + BS with mild low mid abd tender, no gaurding Neurological: Pt is alert. Not confused , motor intact Skin: Skin is warm. No  erythema. No rash Pelvic: deferred Psychiatric: Pt behavior is normal. Thought content normal.     Assessment & Plan:

## 2013-03-03 NOTE — Patient Instructions (Addendum)
Please take all new medication as prescribed - the antibiotic (you probably want to pay cash for this one) The specimen will also be sent for culture, but hopefully the first antibiotic will not need to be changed Please take all new medication as prescribed - the estrogen cream Please use the hardcopy prescriptions to "shop around", and you should let us know if the cream is too expensive, as there may be a less expensive one with your insurance Thank you for enrolling in MyChart. Please follow the instructions below to securely access your online medical record. MyChart allows you to send messages to your doctor, view your test results, renew your prescriptions, schedule appointments, and more. To Log into My Chart online, please go by [employer] or [employer] to [email or web address], or download the MyChart App from the [employer] of [employer] .  Your Username is: [email or web address] (pass [email or web address])

## 2013-03-04 LAB — URINE CULTURE
Colony Count: NO GROWTH
Organism ID, Bacteria: NO GROWTH

## 2013-03-19 ENCOUNTER — Encounter: Payer: Self-pay | Admitting: Internal Medicine

## 2013-03-19 MED ORDER — FLUCONAZOLE 150 MG PO TABS: ORAL_TABLET | ORAL | Status: DC

## 2013-04-21 ENCOUNTER — Encounter: Payer: Self-pay | Admitting: Internal Medicine

## 2013-04-21 DIAGNOSIS — Z0000 Encounter for general adult medical examination without abnormal findings: Secondary | ICD-10-CM

## 2013-04-22 ENCOUNTER — Telehealth: Payer: Self-pay

## 2013-04-22 DIAGNOSIS — N644 Mastodynia: Secondary | ICD-10-CM

## 2013-04-22 NOTE — Telephone Encounter (Signed)
[facility] called requesting order for screening mammogram be changed to a  Diagnostic mammogram since pt is c/o LT breast pain

## 2013-04-22 NOTE — Telephone Encounter (Signed)
done

## 2013-05-06 ENCOUNTER — Ambulatory Visit
Admission: RE | Admit: 2013-05-06 | Discharge: 2013-05-06 | Disposition: A | Discharge status: Home / Self Care | Payer: 59 | Source: Ambulatory Visit | Attending: Internal Medicine | Admitting: Internal Medicine

## 2013-05-06 DIAGNOSIS — N644 Mastodynia: Secondary | ICD-10-CM

## 2013-08-22 ENCOUNTER — Other Ambulatory Visit: Payer: Self-pay | Admitting: Internal Medicine

## 2013-09-16 ENCOUNTER — Telehealth: Payer: Self-pay | Admitting: Internal Medicine

## 2013-09-16 NOTE — Telephone Encounter (Signed)
Ok with me 

## 2013-09-16 NOTE — Telephone Encounter (Signed)
Pt called request to be work in for CPE by the end of this month. Pt stated that her spouse's insurance is going to run out 10/09/13. Please advise.

## 2013-09-17 NOTE — Telephone Encounter (Signed)
Pt has an appt on 09/25/13 at 3:00 pm. Waiting for pt to call back to confirm this appt.

## 2013-09-21 ENCOUNTER — Other Ambulatory Visit (INDEPENDENT_AMBULATORY_CARE_PROVIDER_SITE_OTHER): Payer: 59

## 2013-09-21 DIAGNOSIS — Z0000 Encounter for general adult medical examination without abnormal findings: Secondary | ICD-10-CM

## 2013-09-21 LAB — URINALYSIS, ROUTINE W REFLEX MICROSCOPIC
Bilirubin Urine: NEGATIVE
Ketones, ur: NEGATIVE
Leukocytes, UA: NEGATIVE
Nitrite: NEGATIVE
Specific Gravity, Urine: <=1.005 (ref 1.000–1.030)
Total Protein, Urine: NEGATIVE
Urine Glucose: NEGATIVE
Urobilinogen, UA: 0.2 (ref 0.0–1.0)
pH: 6.5 (ref 5.0–8.0)

## 2013-09-21 LAB — TSH: TSH: 1.43 u[IU]/mL (ref 0.35–5.50)

## 2013-09-21 LAB — HEPATIC FUNCTION PANEL
ALT: 16 U/L (ref 0–35)
AST: 18 U/L (ref 0–37)
Albumin: 4.1 g/dL (ref 3.5–5.2)
Alkaline Phosphatase: 70 U/L (ref 39–117)
Bilirubin, Direct: 0 mg/dL (ref 0.0–0.3)
Total Bilirubin: 0.5 mg/dL (ref 0.3–1.2)
Total Protein: 8.1 g/dL (ref 6.0–8.3)

## 2013-09-21 LAB — CBC WITH DIFFERENTIAL/PLATELET
Basophils Absolute: 0 10*3/uL (ref 0.0–0.1)
Basophils Relative: 0.6 % (ref 0.0–3.0)
Eosinophils Absolute: 0.2 10*3/uL (ref 0.0–0.7)
Eosinophils Relative: 3.4 % (ref 0.0–5.0)
HCT: 35.4 % — ABNORMAL LOW (ref 36.0–46.0)
Hemoglobin: 11.9 g/dL — ABNORMAL LOW (ref 12.0–15.0)
Lymphocytes Relative: 31.3 % (ref 12.0–46.0)
Lymphs Abs: 1.6 10*3/uL (ref 0.7–4.0)
MCHC: 33.5 g/dL (ref 30.0–36.0)
MCV: 89.7 fl (ref 78.0–100.0)
Monocytes Absolute: 0.4 10*3/uL (ref 0.1–1.0)
Monocytes Relative: 6.9 % (ref 3.0–12.0)
Neutro Abs: 3 10*3/uL (ref 1.4–7.7)
Neutrophils Relative %: 57.8 % (ref 43.0–77.0)
Platelets: 254 10*3/uL (ref 150.0–400.0)
RBC: 3.95 Mil/uL (ref 3.87–5.11)
RDW: 13.4 % (ref 11.5–14.6)
WBC: 5.3 10*3/uL (ref 4.5–10.5)

## 2013-09-21 LAB — LIPID PANEL
Cholesterol: 176 mg/dL (ref 0–200)
HDL: 61.1 mg/dL (ref >39.00)
LDL Cholesterol: 104 mg/dL — ABNORMAL HIGH (ref 0–99)
Total CHOL/HDL Ratio: 3
Triglycerides: 57 mg/dL (ref 0.0–149.0)
VLDL: 11.4 mg/dL (ref 0.0–40.0)

## 2013-09-21 LAB — BASIC METABOLIC PANEL
BUN: 9 mg/dL (ref 6–23)
CO2: 26 mEq/L (ref 19–32)
Calcium: 9.3 mg/dL (ref 8.4–10.5)
Chloride: 103 mEq/L (ref 96–112)
Creatinine, Ser: 0.7 mg/dL (ref 0.4–1.2)
GFR: 107.06 mL/min (ref >60.00)
Glucose, Bld: 107 mg/dL — ABNORMAL HIGH (ref 70–99)
Potassium: 3.8 mEq/L (ref 3.5–5.1)
Sodium: 139 mEq/L (ref 135–145)

## 2013-09-25 ENCOUNTER — Ambulatory Visit (INDEPENDENT_AMBULATORY_CARE_PROVIDER_SITE_OTHER): Payer: 59 | Admitting: Internal Medicine

## 2013-09-25 ENCOUNTER — Encounter: Payer: Self-pay | Admitting: Internal Medicine

## 2013-09-25 VITALS — BP 110/80 | HR 76 | Temp 98.2°F | Ht 64.0 in | Wt 234.0 lb

## 2013-09-25 DIAGNOSIS — Z0000 Encounter for general adult medical examination without abnormal findings: Secondary | ICD-10-CM

## 2013-09-25 DIAGNOSIS — Z23 Encounter for immunization: Secondary | ICD-10-CM

## 2013-09-25 MED ORDER — CYCLOBENZAPRINE HCL 5 MG PO TABS: 5.0000 mg | ORAL_TABLET | Freq: Three times a day (TID) | ORAL | Status: DC | PRN

## 2013-09-25 NOTE — Progress Notes (Signed)
Subjective:    Patient ID: Melody Lewis, female    DOB: [birth year], 52 y.o.   MRN: [id number]  HPI  Here for wellness and f/u;  Overall doing ok;  Pt denies CP, worsening SOB, DOE, wheezing, orthopnea, PND, worsening LE edema, palpitations, dizziness or syncope.  Pt denies neurological change such as new headache, facial or extremity weakness.  Pt denies polydipsia, polyuria, or low sugar symptoms. Pt states overall good compliance with treatment and medications, good tolerability, and has been trying to follow lower cholesterol diet.  Pt denies worsening depressive symptoms, suicidal ideation or panic. No fever, night sweats, wt loss, loss of appetite, or other constitutional symptoms.  Pt states good ability with ADL's, has low fall risk, home safety reviewed and adequate, no other significant changes in hearing or vision, and only occasionally active with exercise. No acute compalints. For flu shot today Past Medical History  Diagnosis Date  . SINUSITIS- ACUTE-NOS 12/22/2009  . ALLERGIC RHINITIS 10/18/2008  . SHOULDER PAIN, LEFT 10/18/2010  . WRIST PAIN, RIGHT 10/18/2008  . KNEE PAIN, RIGHT 10/18/2008  . LOW BACK PAIN 10/18/2008  . LUMBAR RADICULOPATHY, RIGHT 06/28/2009  . BACK PAIN, UPPER 10/18/2008  . BACK PAIN, UPPER 10/18/2008  . UNSPECIFIED NEURALGIA NEURITIS AND RADICULITIS 10/18/2008  . INTERMITTENT VERTIGO 11/01/2009  . Dysuria 10/18/2010  . GERD (gastroesophageal reflux disease) 06/20/2011  . Seasonal allergies    Past Surgical History  Procedure Laterality Date  . Tonsillectomy    . Abdominal hysterectomy  05/2005    due to fibroids, still born, ovaries intact    reports that she has never smoked. She does not have any smokeless tobacco history on file. She reports that she does not drink alcohol or use illicit drugs. family history includes Arthritis in her father and mother; Colon cancer in her father and maternal aunt; Dementia in her father; Diabetes in her father; Hypertension in  her brother, daughter, and mother; Kidney disease in her mother; Parkinsonism in her father. Allergies  Allergen Reactions  . Sulfamethoxazole-Trimethoprim     REACTION: Faint   Current Outpatient Prescriptions on File Prior to Visit  Medication Sig Dispense Refill  . Alum & Mag Hydroxide-Simeth (MAGIC MOUTHWASH) SOLN Take 5 mLs by mouth 4 (four) times daily as needed.  60 mL  1  . estradiol (ESTRACE) 0.1 MG/GM vaginal cream Place 0.25 Applicatorfuls vaginally daily.  42.5 g  12  . fluconazole (DIFLUCAN) 150 MG tablet 1 tab by mouth every 3 days as needed  2 tablet  1  . fluticasone (FLONASE) 50 MCG/ACT nasal spray Place 2 sprays into the nose daily.  16 g  5  . levocetirizine (XYZAL) 5 MG tablet TAKE 1 TABLET BY MOUTH EVERY EVENING  90 tablet  1  . pantoprazole (PROTONIX) 40 MG tablet Take 1 tablet (40 mg total) by mouth daily.  30 tablet  11  . [DISCONTINUED] mometasone (NASONEX) 50 MCG/ACT nasal spray 2 sprays by Nasal route daily.  17 g  2   No current facility-administered medications on file prior to visit.   Review of Systems VS noted,  Constitutional: Pt is oriented to person, place, and time. Appears well-developed and well-nourished.  Head: Normocephalic and atraumatic.  Right Ear: External ear normal.  Left Ear: External ear normal.  Nose: Nose normal.  Mouth/Throat: Oropharynx is clear and moist.  Eyes: Conjunctivae and EOM are normal. Pupils are equal, round, and reactive to light.  Neck: Normal range of motion. Neck supple. No JVD present.  No tracheal deviation present.  Cardiovascular: Normal rate, regular rhythm, normal heart sounds and intact distal pulses.   Pulmonary/Chest: Effort normal and breath sounds normal.  Abdominal: Soft. Bowel sounds are normal. There is no tenderness. No HSM  Musculoskeletal: Normal range of motion. Exhibits no edema.  Lymphadenopathy:  Has no cervical adenopathy.  Neurological: Pt is alert and oriented to person, place, and time. Pt has  normal reflexes. No cranial nerve deficit.  Skin: Skin is warm and dry. No rash noted.  Psychiatric:  Has  normal mood and affect. Behavior is normal.      Objective:   Physical Exam BP 110/80  Pulse 76  Temp(Src) 98.2 F (36.8 C) (Oral)  Ht 5\' 4"  (1.626 m)  Wt 234 lb (106.142 kg)  BMI 40.15 kg/m2  SpO2 98% VS noted,  Constitutional: Pt is oriented to person, place, and time. Appears well-developed and well-nourished.  Head: Normocephalic and atraumatic.  Right Ear: External ear normal.  Left Ear: External ear normal.  Nose: Nose normal.  Mouth/Throat: Oropharynx is clear and moist.  Eyes: Conjunctivae and EOM are normal. Pupils are equal, round, and reactive to light.  Neck: Normal range of motion. Neck supple. No JVD present. No tracheal deviation present.  Cardiovascular: Normal rate, regular rhythm, normal heart sounds and intact distal pulses.   Pulmonary/Chest: Effort normal and breath sounds normal.  Abdominal: Soft. Bowel sounds are normal. There is no tenderness. No HSM  Musculoskeletal: Normal range of motion. Exhibits no edema.  Lymphadenopathy:  Has no cervical adenopathy.  Neurological: Pt is alert and oriented to person, place, and time. Pt has normal reflexes. No cranial nerve deficit.  Skin: Skin is warm and dry. No rash noted.  Psychiatric:  Has  normal mood and affect. Behavior is normal.     Assessment & Plan:

## 2013-09-25 NOTE — Patient Instructions (Addendum)
You had the flu shot today Please remember to followup with your GYN for the yearly pap smear and/or mammogram as you do Please continue all other medications as before, and refills have been done if requested. Please have the pharmacy call with any other refills you may need. Please keep your appointments with your specialists as you may have planned You are given the copy of your lab work today Please continue your efforts at being more active, low cholesterol diet, and weight control. You are otherwise up to date with prevention measures today.  Please return in 1 year for your yearly visit, or sooner if needed, with Lab testing done 3-5 days before

## 2013-09-25 NOTE — Assessment & Plan Note (Signed)

## 2013-10-18 ENCOUNTER — Other Ambulatory Visit: Payer: Self-pay | Admitting: Internal Medicine

## 2014-04-20 ENCOUNTER — Encounter: Payer: Self-pay | Admitting: Internal Medicine

## 2014-04-20 ENCOUNTER — Other Ambulatory Visit (INDEPENDENT_AMBULATORY_CARE_PROVIDER_SITE_OTHER): Payer: 59

## 2014-04-20 ENCOUNTER — Ambulatory Visit (INDEPENDENT_AMBULATORY_CARE_PROVIDER_SITE_OTHER): Payer: 59 | Admitting: Internal Medicine

## 2014-04-20 VITALS — BP 128/88 | HR 75 | Temp 98.1°F | Ht 64.0 in | Wt 238.0 lb

## 2014-04-20 DIAGNOSIS — R350 Frequency of micturition: Secondary | ICD-10-CM

## 2014-04-20 DIAGNOSIS — N3090 Cystitis, unspecified without hematuria: Secondary | ICD-10-CM

## 2014-04-20 LAB — URINALYSIS
Bilirubin Urine: NEGATIVE
Ketones, ur: NEGATIVE
Leukocytes, UA: NEGATIVE
Nitrite: NEGATIVE
Specific Gravity, Urine: <=1.005 — AB (ref 1.000–1.030)
Total Protein, Urine: NEGATIVE
Urine Glucose: NEGATIVE
Urobilinogen, UA: 0.2 (ref 0.0–1.0)
pH: 7 (ref 5.0–8.0)

## 2014-04-20 MED ORDER — CIPROFLOXACIN HCL 250 MG PO TABS: 250.0000 mg | ORAL_TABLET | Freq: Two times a day (BID) | ORAL | Status: DC

## 2014-04-20 MED ORDER — FESOTERODINE FUMARATE ER 4 MG PO TB24: 4.0000 mg | ORAL_TABLET | Freq: Every day | ORAL | Status: DC

## 2014-04-20 NOTE — Assessment & Plan Note (Signed)
UA and Cx See Rx

## 2014-04-20 NOTE — Progress Notes (Signed)
Pre visit review using our clinic review tool, if applicable. No additional management support is needed unless otherwise documented below in the visit note. 

## 2014-04-20 NOTE — Assessment & Plan Note (Signed)
UA and Cx 

## 2014-04-20 NOTE — Progress Notes (Signed)
   Subjective:    Patient ID: Melody Lewis, female    DOB: Jun 22, 1961, 53 y.o.   MRN: [id number]  Urinary Frequency  This is a recurrent problem. The current episode started more than 1 year ago. The problem has been gradually worsening. The quality of the pain is described as burning. The patient is experiencing no pain. Associated symptoms include frequency and urgency. Pertinent negatives include no chills, discharge or nausea.      Review of Systems  Constitutional: Negative for chills, activity change, appetite change, fatigue and unexpected weight change.  HENT: Negative for congestion, mouth sores and sinus pressure.   Eyes: Negative for visual disturbance.  Respiratory: Negative for cough and chest tightness.   Gastrointestinal: Negative for nausea and abdominal pain.  Genitourinary: Positive for urgency and frequency. Negative for difficulty urinating and vaginal pain.  Musculoskeletal: Negative for back pain and gait problem.  Skin: Negative for pallor and rash.  Neurological: Negative for dizziness, tremors, weakness, numbness and headaches.  Psychiatric/Behavioral: Negative for confusion and sleep disturbance.       Objective:   Physical Exam  Constitutional: She appears well-developed. No distress.  Obese   HENT:  Head: Normocephalic.  Right Ear: External ear normal.  Left Ear: External ear normal.  Nose: Nose normal.  Mouth/Throat: Oropharynx is clear and moist.  Eyes: Conjunctivae are normal. Pupils are equal, round, and reactive to light. Right eye exhibits no discharge. Left eye exhibits no discharge.  Neck: Normal range of motion. Neck supple. No JVD present. No tracheal deviation present. No thyromegaly present.  Cardiovascular: Normal rate, regular rhythm and normal heart sounds.   Pulmonary/Chest: No stridor. No respiratory distress. She has no wheezes.  Abdominal: Soft. Bowel sounds are normal. She exhibits no distension and no mass. There is no  tenderness. There is no rebound and no guarding.  Musculoskeletal: She exhibits no edema and no tenderness.  Lymphadenopathy:    She has no cervical adenopathy.  Neurological: She displays normal reflexes. No cranial nerve deficit. She exhibits normal muscle tone. Coordination normal.  Skin: No rash noted. No erythema.  Psychiatric: She has a normal mood and affect. Her behavior is normal. Judgment and thought content normal.          Assessment & Plan:

## 2014-04-21 LAB — CULTURE, URINE COMPREHENSIVE
Colony Count: NO GROWTH
Organism ID, Bacteria: NO GROWTH

## 2014-06-19 ENCOUNTER — Other Ambulatory Visit: Payer: Self-pay | Admitting: Internal Medicine

## 2014-07-08 ENCOUNTER — Encounter: Payer: Self-pay | Admitting: Internal Medicine

## 2014-08-12 ENCOUNTER — Encounter: Payer: Self-pay | Admitting: Internal Medicine

## 2014-09-06 ENCOUNTER — Encounter: Payer: Self-pay | Admitting: Internal Medicine

## 2014-09-06 ENCOUNTER — Other Ambulatory Visit: Payer: Self-pay

## 2014-09-06 DIAGNOSIS — Z1231 Encounter for screening mammogram for malignant neoplasm of breast: Secondary | ICD-10-CM

## 2014-09-07 ENCOUNTER — Encounter: Payer: Self-pay | Admitting: Internal Medicine

## 2014-09-07 DIAGNOSIS — Z0000 Encounter for general adult medical examination without abnormal findings: Secondary | ICD-10-CM

## 2014-09-07 NOTE — Telephone Encounter (Signed)
[place] for Melody Lewis to offer pt yearly wellness visit next available

## 2014-09-08 ENCOUNTER — Telehealth: Payer: Self-pay | Admitting: Internal Medicine

## 2014-09-08 NOTE — Telephone Encounter (Signed)
Patient has cpe for October.  Can you update labs in system.

## 2014-09-08 NOTE — Telephone Encounter (Signed)
Called the patient informed labs are in the computer that PCP put in from last OV and still good.

## 2014-09-17 ENCOUNTER — Ambulatory Visit
Admission: RE | Admit: 2014-09-17 | Discharge: 2014-09-17 | Disposition: A | Discharge status: Home / Self Care | Payer: 59 | Source: Ambulatory Visit

## 2014-09-17 DIAGNOSIS — Z1231 Encounter for screening mammogram for malignant neoplasm of breast: Secondary | ICD-10-CM

## 2014-09-17 LAB — HM MAMMOGRAPHY

## 2014-09-22 ENCOUNTER — Other Ambulatory Visit (INDEPENDENT_AMBULATORY_CARE_PROVIDER_SITE_OTHER): Payer: 59

## 2014-09-22 ENCOUNTER — Ambulatory Visit (AMBULATORY_SURGERY_CENTER): Payer: Self-pay | Admitting: *Deleted

## 2014-09-22 VITALS — Ht 64.0 in | Wt 235.8 lb

## 2014-09-22 DIAGNOSIS — Z800 Family history of malignant neoplasm of digestive organs: Secondary | ICD-10-CM

## 2014-09-22 DIAGNOSIS — Z0000 Encounter for general adult medical examination without abnormal findings: Secondary | ICD-10-CM

## 2014-09-22 LAB — CBC WITH DIFFERENTIAL/PLATELET
Basophils Absolute: 0 10*3/uL (ref 0.0–0.1)
Basophils Relative: 0.4 % (ref 0.0–3.0)
Eosinophils Absolute: 0.2 10*3/uL (ref 0.0–0.7)
Eosinophils Relative: 3.2 % (ref 0.0–5.0)
HCT: 39 % (ref 36.0–46.0)
Hemoglobin: 12.6 g/dL (ref 12.0–15.0)
Lymphocytes Relative: 37.5 % (ref 12.0–46.0)
Lymphs Abs: 2.1 10*3/uL (ref 0.7–4.0)
MCHC: 32.2 g/dL (ref 30.0–36.0)
MCV: 91.9 fl (ref 78.0–100.0)
Monocytes Absolute: 0.4 10*3/uL (ref 0.1–1.0)
Monocytes Relative: 7.2 % (ref 3.0–12.0)
Neutro Abs: 2.8 10*3/uL (ref 1.4–7.7)
Neutrophils Relative %: 51.7 % (ref 43.0–77.0)
Platelets: 257 10*3/uL (ref 150.0–400.0)
RBC: 4.25 Mil/uL (ref 3.87–5.11)
RDW: 13.1 % (ref 11.5–15.5)
WBC: 5.5 10*3/uL (ref 4.0–10.5)

## 2014-09-22 LAB — URINALYSIS, ROUTINE W REFLEX MICROSCOPIC
Bilirubin Urine: NEGATIVE
Ketones, ur: NEGATIVE
Leukocytes, UA: NEGATIVE
Nitrite: NEGATIVE
Specific Gravity, Urine: 1.015 (ref 1.000–1.030)
Total Protein, Urine: NEGATIVE
Urine Glucose: NEGATIVE
Urobilinogen, UA: 0.2 (ref 0.0–1.0)
pH: 7 (ref 5.0–8.0)

## 2014-09-22 LAB — BASIC METABOLIC PANEL
BUN: 8 mg/dL (ref 6–23)
CO2: 30 mEq/L (ref 19–32)
Calcium: 9.1 mg/dL (ref 8.4–10.5)
Chloride: 106 mEq/L (ref 96–112)
Creatinine, Ser: 0.8 mg/dL (ref 0.4–1.2)
GFR: 94.59 mL/min (ref >60.00)
Glucose, Bld: 105 mg/dL — ABNORMAL HIGH (ref 70–99)
Potassium: 3.9 mEq/L (ref 3.5–5.1)
Sodium: 140 mEq/L (ref 135–145)

## 2014-09-22 LAB — TSH: TSH: 2.76 u[IU]/mL (ref 0.35–4.50)

## 2014-09-22 LAB — HEPATIC FUNCTION PANEL
ALT: 15 U/L (ref 0–35)
AST: 18 U/L (ref 0–37)
Albumin: 3.5 g/dL (ref 3.5–5.2)
Alkaline Phosphatase: 71 U/L (ref 39–117)
Bilirubin, Direct: 0.1 mg/dL (ref 0.0–0.3)
Total Bilirubin: 0.5 mg/dL (ref 0.2–1.2)
Total Protein: 8 g/dL (ref 6.0–8.3)

## 2014-09-22 LAB — LIPID PANEL
Cholesterol: 165 mg/dL (ref 0–200)
HDL: 49.8 mg/dL (ref >39.00)
LDL Cholesterol: 103 mg/dL — ABNORMAL HIGH (ref 0–99)
NonHDL: 115.2
Total CHOL/HDL Ratio: 3
Triglycerides: 61 mg/dL (ref 0.0–149.0)
VLDL: 12.2 mg/dL (ref 0.0–40.0)

## 2014-09-22 MED ORDER — MOVIPREP 100 G PO SOLR: 1.0000 | Freq: Once | ORAL | Status: DC

## 2014-09-22 NOTE — Progress Notes (Signed)
No issues with past sedation. ewm No home 02 use. ewm No egg or soy allergy. ewm No diet pills. ewm emmi video to pt's e mail ewm

## 2014-09-28 ENCOUNTER — Encounter: Payer: Self-pay | Admitting: Internal Medicine

## 2014-09-28 ENCOUNTER — Ambulatory Visit (INDEPENDENT_AMBULATORY_CARE_PROVIDER_SITE_OTHER): Payer: 59 | Admitting: Internal Medicine

## 2014-09-28 VITALS — BP 122/80 | HR 107 | Temp 97.7°F | Ht 64.0 in | Wt 236.8 lb

## 2014-09-28 DIAGNOSIS — Z0000 Encounter for general adult medical examination without abnormal findings: Secondary | ICD-10-CM

## 2014-09-28 DIAGNOSIS — Z23 Encounter for immunization: Secondary | ICD-10-CM

## 2014-09-28 NOTE — Progress Notes (Signed)
Subjective:    Patient ID: Melody Lewis, female    DOB: [birth year], 53 y.o.   MRN: [id number]  HPI  Here for wellness and f/u;  Overall doing ok;  Pt denies CP, worsening SOB, DOE, wheezing, orthopnea, PND, worsening LE edema, palpitations, dizziness or syncope.  Pt denies neurological change such as new headache, facial or extremity weakness.  Pt denies polydipsia, polyuria, or low sugar symptoms. Pt states overall good compliance with treatment and medications, good tolerability, and has been trying to follow lower cholesterol diet.  Pt denies worsening depressive symptoms, suicidal ideation or panic. No fever, night sweats, wt loss, loss of appetite, or other constitutional symptoms.  Pt states good ability with ADL's, has low fall risk, home safety reviewed and adequate, no other significant changes in hearing or vision, and only occasionally active with exercise, usually only walking at work.  Getting a treadmill for xmas. Past Medical History  Diagnosis Date  . SINUSITIS- ACUTE-NOS 12/22/2009  . ALLERGIC RHINITIS 10/18/2008  . SHOULDER PAIN, LEFT 10/18/2010  . WRIST PAIN, RIGHT 10/18/2008  . KNEE PAIN, RIGHT 10/18/2008  . LOW BACK PAIN 10/18/2008  . LUMBAR RADICULOPATHY, RIGHT 06/28/2009  . BACK PAIN, UPPER 10/18/2008  . BACK PAIN, UPPER 10/18/2008  . UNSPECIFIED NEURALGIA NEURITIS AND RADICULITIS 10/18/2008  . INTERMITTENT VERTIGO 11/01/2009  . Dysuria 10/18/2010  . GERD (gastroesophageal reflux disease) 06/20/2011  . Seasonal allergies   . Allergy   . Blood transfusion without reported diagnosis    Past Surgical History  Procedure Laterality Date  . Tonsillectomy  1987  . Abdominal hysterectomy  05/2005    due to fibroids, still born, ovaries intact  . Colonoscopy  08-04-2009  . Cesarean section  1983  . Dilation and curettage of uterus  2006    reports that she has never smoked. She has never used smokeless tobacco. She reports that she does not drink alcohol or use illicit  drugs. family history includes Arthritis in her father and mother; Colon cancer in her father and maternal aunt; Dementia in her father; Diabetes in her father; Hypertension in her brother, daughter, and mother; Kidney disease in her mother; Parkinsonism in her father. There is no history of Rectal cancer or Stomach cancer. Allergies  Allergen Reactions  . Sulfamethoxazole-Trimethoprim     REACTION: Faint   Current Outpatient Prescriptions on File Prior to Visit  Medication Sig Dispense Refill  . cyclobenzaprine (FLEXERIL) 5 MG tablet Take 1 tablet (5 mg total) by mouth 3 (three) times daily as needed for muscle spasms.  60 tablet  2  . levocetirizine (XYZAL) 5 MG tablet TAKE 1 TABLET BY MOUTH EVERY EVENING  90 tablet  0  . MOVIPREP 100 G SOLR Take 1 kit (200 g total) by mouth once. moviprep as directed. No substitutions  1 kit  0  . pantoprazole (PROTONIX) 40 MG tablet TAKE 1 TABLET BY MOUTH EVERY DAY  30 tablet  6  . psyllium (METAMUCIL SMOOTH TEXTURE) 28 % packet Take 1 packet by mouth daily.      . [DISCONTINUED] mometasone (NASONEX) 50 MCG/ACT nasal spray 2 sprays by Nasal route daily.  17 g  2   No current facility-administered medications on file prior to visit.   Review of Systems Constitutional: Negative for increased diaphoresis, other activity, appetite or other siginficant weight change  HENT: Negative for worsening hearing loss, ear pain, facial swelling, mouth sores and neck stiffness.   Eyes: Negative for other worsening pain, redness or visual  disturbance.  Respiratory: Negative for shortness of breath and wheezing.   Cardiovascular: Negative for chest pain and palpitations.  Gastrointestinal: Negative for diarrhea, blood in stool, abdominal distention or other pain Genitourinary: Negative for hematuria, flank pain or change in urine volume.  Musculoskeletal: Negative for myalgias or other joint complaints.  Skin: Negative for color change and wound.  Neurological:  Negative for syncope and numbness. other than noted Hematological: Negative for adenopathy. or other swelling Psychiatric/Behavioral: Negative for hallucinations, self-injury, decreased concentration or other worsening agitation.      Objective:   Physical Exam BP 122/80  Pulse 107  Temp(Src) 97.7 F (36.5 C) (Oral)  Ht _0  (1.626 m)  Wt 236 lb 12 oz (107.389 kg)  BMI 40.62 kg/m2  SpO2 98% VS noted,  Constitutional: Pt is oriented to person, place, and time. Appears well-developed and well-nourished.  Head: Normocephalic and atraumatic.  Right Ear: External ear normal.  Left Ear: External ear normal.  Nose: Nose normal.  Mouth/Throat: Oropharynx is clear and moist.  Eyes: Conjunctivae and EOM are normal. Pupils are equal, round, and reactive to light.  Neck: Normal range of motion. Neck supple. No JVD present. No tracheal deviation present.  Cardiovascular: Normal rate, regular rhythm, normal heart sounds and intact distal pulses.   Pulmonary/Chest: Effort normal and breath sounds without rales or wheezing  Abdominal: Soft. Bowel sounds are normal. NT. No HSM  Musculoskeletal: Normal range of motion. Exhibits no edema.  Lymphadenopathy:  Has no cervical adenopathy.  Neurological: Pt is alert and oriented to person, place, and time. Pt has normal reflexes. No cranial nerve deficit. Motor grossly intact Skin: Skin is warm and dry. No rash noted.  Psychiatric:  Has normal mood and affect. Behavior is normal.     Assessment & Plan:

## 2014-09-28 NOTE — Progress Notes (Signed)
Pre visit review using our clinic review tool, if applicable. No additional management support is needed unless otherwise documented below in the visit note. 

## 2014-09-28 NOTE — Assessment & Plan Note (Addendum)

## 2014-09-28 NOTE — Patient Instructions (Addendum)

## 2014-09-29 ENCOUNTER — Telehealth: Payer: Self-pay | Admitting: Internal Medicine

## 2014-09-29 NOTE — Telephone Encounter (Signed)
Coupon for free MoviPrep called in to pt's pharmacy.  Pt aware

## 2014-10-07 ENCOUNTER — Ambulatory Visit (AMBULATORY_SURGERY_CENTER): Payer: 59 | Admitting: Internal Medicine

## 2014-10-07 ENCOUNTER — Encounter: Payer: Self-pay | Admitting: Internal Medicine

## 2014-10-07 VITALS — BP 114/75 | HR 67 | Temp 90.0°F | Resp 11 | Ht 64.0 in | Wt 235.0 lb

## 2014-10-07 DIAGNOSIS — Z1211 Encounter for screening for malignant neoplasm of colon: Secondary | ICD-10-CM

## 2014-10-07 DIAGNOSIS — Z8371 Family history of colonic polyps: Secondary | ICD-10-CM

## 2014-10-07 DIAGNOSIS — Z800 Family history of malignant neoplasm of digestive organs: Secondary | ICD-10-CM

## 2014-10-07 MED ORDER — SODIUM CHLORIDE 0.9 % IV SOLN: 500.0000 mL | INTRAVENOUS | Status: DC

## 2014-10-07 NOTE — Op Note (Signed)
[facility]  [address]. [place], [id number]   COLONOSCOPY PROCEDURE REPORT  PATIENT: Melody Lewis, Melody Lewis  MR#: [id number] BIRTHDATE: Sep 08, 1961 , 80  yrs. old GENDER: female ENDOSCOPIST: Eustace Quail, MD REFERRED FF:[id number] Recall, PROCEDURE DATE:  10/07/2014 PROCEDURE:   Colonoscopy, screening First Screening Colonoscopy - Avg.  risk and is 50 yrs.  old or older - No.  Prior Negative Screening - Now for repeat screening. N/A  History of Adenoma - Now for follow-up colonoscopy & has been > or = to 3 yrs.  N/A  Polyps Removed Today? No.  Recommend repeat exam, <10 yrs? Yes.  Polyps Removed Today? No.  Recommend repeat exam, <10 yrs? Yes.  High risk (family or personal hx). ASA CLASS:   Class II INDICATIONS:patient's immediate family history of colon cancer(father 77s), other first degree relative with adenomatous polyps. . Negative index exam 2010 MEDICATIONS: Monitored anesthesia care and Propofol 270 mg IV  DESCRIPTION OF PROCEDURE:   After the risks benefits and alternatives of the procedure were thoroughly explained, informed consent was obtained.  The digital rectal exam revealed no abnormalities of the rectum.   The LB [id number]  endoscope was introduced through the anus and advanced to the cecum, which was identified by both the appendix and ileocecal valve. No adverse events experienced.   The quality of the prep was excellent, using MoviPrep  The instrument was then slowly withdrawn as the colon was fully examined.      COLON FINDINGS: A normal appearing cecum, ileocecal valve, and appendiceal orifice were identified.  The ascending, transverse, descending, sigmoid colon, and rectum appeared unremarkable. Retroflexed views revealed internal hemorrhoids. The time to cecum=3 minutes 45 seconds.  Withdrawal time=8 minutes 06 seconds. The scope was withdrawn and the procedure completed.  COMPLICATIONS: There were no immediate  complications.  ENDOSCOPIC IMPRESSION: 1. Normal colonoscopy  RECOMMENDATIONS: 1. Follow up colonoscopy in 5 years  eSigned:  Eustace Quail, MD 10/07/2014 3:37 PM   cc: Biagio Borg, MD and The Patient

## 2014-10-07 NOTE — Progress Notes (Signed)
Report to PACU, RN, vss, BBS= Clear.  

## 2014-10-07 NOTE — Patient Instructions (Signed)
Discharge instructions given. Normal exam. Resume previous medications. YOU HAD AN ENDOSCOPIC PROCEDURE TODAY AT THE [facility]: Refer to the procedure report that was given to you for any specific questions about what was found during the examination.  If the procedure report does not answer your questions, please call your gastroenterologist to clarify.  If you requested that your care partner not be given the details of your procedure findings, then the procedure report has been included in a sealed envelope for you to review at your convenience later.  YOU SHOULD EXPECT: Some feelings of bloating in the abdomen. Passage of more gas than usual.  Walking can help get rid of the air that was put into your GI tract during the procedure and reduce the bloating. If you had a lower endoscopy (such as a colonoscopy or flexible sigmoidoscopy) you may notice spotting of blood in your stool or on the toilet paper. If you underwent a bowel prep for your procedure, then you may not have a normal bowel movement for a few days.  DIET: Your first meal following the procedure should be a light meal and then it is ok to progress to your normal diet.  A half-sandwich or bowl of soup is an example of a good first meal.  Heavy or fried foods are harder to digest and may make you feel nauseous or bloated.  Likewise meals heavy in dairy and vegetables can cause extra gas to form and this can also increase the bloating.  Drink plenty of fluids but you should avoid alcoholic beverages for 24 hours.  ACTIVITY: Your care partner should take you home directly after the procedure.  You should plan to take it easy, moving slowly for the rest of the day.  You can resume normal activity the day after the procedure however you should NOT DRIVE or use heavy machinery for 24 hours (because of the sedation medicines used during the test).    SYMPTOMS TO REPORT IMMEDIATELY: A gastroenterologist can be reached at any hour.   During normal business hours, 8:30 AM to 5:00 PM Monday through Friday, call [phone].  After hours and on weekends, please call the GI answering service at [phone] who will take a message and have the physician on call contact you.   Following lower endoscopy (colonoscopy or flexible sigmoidoscopy):  Excessive amounts of blood in the stool  Significant tenderness or worsening of abdominal pains  Swelling of the abdomen that is new, acute  Fever of 100F or higher  FOLLOW UP: If any biopsies were taken you will be contacted by phone or by letter within the next 1-3 weeks.  Call your gastroenterologist if you have not heard about the biopsies in 3 weeks.  Our staff will call the home number listed on your records the next business day following your procedure to check on you and address any questions or concerns that you may have at that time regarding the information given to you following your procedure. This is a courtesy call and so if there is no answer at the home number and we have not heard from you through the emergency physician on call, we will assume that you have returned to your regular daily activities without incident.  SIGNATURES/CONFIDENTIALITY: You and/or your care partner have signed paperwork which will be entered into your electronic medical record.  These signatures attest to the fact that that the information above on your After Visit Summary has been reviewed and is understood.    Full responsibility of the confidentiality of this discharge information lies with you and/or your care-partner.  

## 2014-10-08 ENCOUNTER — Telehealth: Payer: Self-pay | Admitting: *Deleted

## 2014-10-08 NOTE — Telephone Encounter (Signed)
  Follow up Call-  Call back number 10/07/2014  Post procedure Call Back phone  # [phone]  Permission to leave phone message Yes     Patient questions:  Do you have a fever, pain , or abdominal swelling? No. Pain Score  0 *  Have you tolerated food without any problems? Yes.    Have you been able to return to your normal activities? Yes.    Do you have any questions about your discharge instructions: Diet   No. Medications  No. Follow up visit  No.  Do you have questions or concerns about your Care? No.  Actions: * If pain score is 4 or above: No action needed, pain <4.

## 2014-10-25 ENCOUNTER — Other Ambulatory Visit: Payer: Self-pay | Admitting: Internal Medicine

## 2014-12-25 ENCOUNTER — Encounter: Payer: Self-pay | Admitting: Internal Medicine

## 2014-12-25 ENCOUNTER — Ambulatory Visit (INDEPENDENT_AMBULATORY_CARE_PROVIDER_SITE_OTHER): Payer: 59 | Admitting: Internal Medicine

## 2014-12-25 VITALS — BP 120/80 | HR 85 | Temp 97.9°F | Resp 20 | Ht 64.0 in | Wt 239.0 lb

## 2014-12-25 DIAGNOSIS — B029 Zoster without complications: Secondary | ICD-10-CM

## 2014-12-25 MED ORDER — GABAPENTIN 100 MG PO CAPS: ORAL_CAPSULE | ORAL | Status: DC

## 2014-12-25 MED ORDER — FAMCICLOVIR 500 MG PO TABS: 500.0000 mg | ORAL_TABLET | Freq: Three times a day (TID) | ORAL | Status: DC

## 2014-12-25 NOTE — Progress Notes (Signed)
   Subjective:    Patient ID: Melody Lewis, female    DOB: 12-Dec-1960, 54 y.o.   MRN: [id number]  HPI   She noted some burning type discomfort in the left shin 12/19/14. This seemed to radiate to the area above the knee and then resolved without treatment  She felt some itching discomfort in her left lateral superior calf yesterday. When she removed her socks she saw an erythematous slightly vesicular lesion over the left upper calf.  Last night she noted smaller papular esions over the dorsum of left foot.  This is in the context of some increased left lumbosacral area pain. She has chronic back pain related to bulging disc.  She has noted some occasional tingling in her left toes    Review of Systems  Fever, chills, sweats, or unexplained weight loss not present. There is no numbness or weakness in extremities.   No loss of control of bladder or bowels.     Objective:   Physical Exam  Pertinent or positive findings include: She has a grade 1/2 systolic murmur at the right base. She has a 2.5 X 1.5 cm erythematous ,slightly vesicular lesion over the left lateral superior calf. She has small papule over the lateral aspect of the dorsum of the left foot. Strength, tone, deep reflexes are normal. Gait including heel and toe walking is normal.  General appearance :adequately nourished; in no distress. Eyes: No conjunctival inflammation or scleral icterus is present. Heart:  Normal rate and regular rhythm. S1 and S2 normal without gallop, click, rub or other extra sounds   Lungs:Chest clear to auscultation; no wheezes, rhonchi,rales ,or rubs present.No increased work of breathing.  Abdomen: bowel sounds normal, soft and non-tender without masses, organomegaly or hernias noted.  No guarding or rebound. No flank or LS tenderness to percussion. Vascular : all pulses equal ; no bruits present. Lymphatic: No lymphadenopathy is noted about the head, neck, axilla            Assessment & Plan:  #1 L5 zoster.The pathophysiology of herpes zoster infection was discussed. Restrictions were also delineated. Medications prescribed and indications discussed.

## 2014-12-25 NOTE — Patient Instructions (Signed)
After having herpes zoster or shingles;  a natural immunity against recurrent shingles should be present for approximately 24 months. After that time shingles immunization would be indicated.

## 2014-12-25 NOTE — Progress Notes (Signed)
Pre visit review using our clinic review tool, if applicable. No additional management support is needed unless otherwise documented below in the visit note. 

## 2014-12-28 ENCOUNTER — Telehealth: Payer: Self-pay | Admitting: *Deleted

## 2014-12-28 NOTE — Telephone Encounter (Signed)
Melody Lewis Patient Name: Melody Lewis Gender: Female DOB: 1961-06-03 Age: 54 Y 51 M 25 D Return Phone Number: [phone] (Primary) Address: City/State/Zip: [place] Client [facility] Night - Client Client Site [facility] - Night Contact Type Call Call Type Triage / Clinical Relationship To Patient Self Return Phone Number [phone] (Primary) Chief Complaint Leg Pain Initial Comment Caller states has been having tingling on her LT leg, and now she has a spot that has popped up, concerned it may be shingles. It is itchy [place] See MD within 1-3 days PreDisposition Go to Urgent Care/Walk-In Clinic Nurse Assessment Nurse: Venetia Maxon, RN, Manuela Schwartz Date/Time (Eastern Time): 12/25/2014 7:51:10 AM Confirm and document reason for call. If symptomatic, describe symptoms. ---Caller states has been having tingling on her LT leg, and now she has a spot that has popped up, concerned it may be shingles. It is itchy If she touches her leg it is burning. neg hx of shingles. no fever There are 2 on top of foot and itch alot These are round. and also she has a bigger area on lateral calf on left that is size of a quarter. spots on her foot are round and itchy. (size less than a pencil eraser) No fever . She has one above the ankle on the left . ( dime size ). She has not seen these before. First noticed the calf one at her work. ( she works in an Engineer, production. ) Has the patient traveled out of the country within the last 30 days? ---No Does the patient require triage? ---Yes Related visit to physician within the last 2 weeks? ---No Does the PT have any chronic conditions? (i.e. diabetes, asthma, etc.) ---Yes List chronic conditions. ---allergies Did the patient indicate they were pregnant? ---No Guidelines Guideline Title Affirmed Question Affirmed Notes Nurse  Date/Time (Eastern Time) [place] starts to look bad (e.g., blister, purplish skin, ulcer) (Exception: just swelling or small red bump) Whitaker, RN, Manuela Schwartz 12/25/2014 7:56:56 AM Disp. Time Eilene Ghazi Time) Disposition Final User PLEASE NOTE: All timestamps contained within this report are represented as [place] Standard Time. CONFIDENTIALTY NOTICE: This fax transmission is intended only for the addressee. It contains information that is legally privileged, confidential or otherwise protected from use or disclosure. If you are not the intended recipient, you are strictly prohibited from reviewing, disclosing, copying using or disseminating any of this information or taking any action in reliance on or regarding this information. If you have received this fax in error, please notify us immediately by telephone so that we can arrange for its return to [place]. Phone: [phone], Toll-Free: [phone], Fax: [phone] Page: 2 of 2 Call Id: [id number] 12/25/2014 8:00:05 AM See PCP When Office is Open (within 3 days) Yes Venetia Maxon, RN, Edwena Bunde Understands: Yes Disagree/Comply: Comply Care Advice Given Per Guideline SEE PCP WITHIN 3 DAYS: You need to be examined within 2 or 3 days. Call your doctor during regular office hours and make an appointment. (Note: if office will be open tomorrow, tell caller to call then, not in 3 days). CALL BACK IF: * You become worse. CARE ADVICE given per Spider Bite - [place] (Adult) guideline. * Bite looks infected (spreading redness or bright red streak) * Fever occurs After Care Instructions Given Call Event Type User Date / Time Description

## 2015-01-24 ENCOUNTER — Encounter: Payer: Self-pay | Admitting: Internal Medicine

## 2015-01-25 MED ORDER — CYCLOBENZAPRINE HCL 5 MG PO TABS: 5.0000 mg | ORAL_TABLET | Freq: Three times a day (TID) | ORAL | Status: DC | PRN

## 2015-03-17 ENCOUNTER — Encounter: Payer: Self-pay | Admitting: Internal Medicine

## 2015-03-17 ENCOUNTER — Ambulatory Visit (INDEPENDENT_AMBULATORY_CARE_PROVIDER_SITE_OTHER): Payer: 59 | Admitting: Internal Medicine

## 2015-03-17 VITALS — BP 132/86 | HR 92 | Temp 98.4°F | Resp 18 | Ht 64.0 in | Wt 236.0 lb

## 2015-03-17 DIAGNOSIS — M25511 Pain in right shoulder: Secondary | ICD-10-CM | POA: Insufficient documentation

## 2015-03-17 MED ORDER — OXYCODONE HCL 5 MG PO TABS: 5.0000 mg | ORAL_TABLET | ORAL | Status: DC | PRN

## 2015-03-17 NOTE — Progress Notes (Signed)
Subjective:    Patient ID: Melody Lewis, female    DOB: [birth year], 54 y.o.   MRN: [id number]  HPI  Here to f/u with acute c/o severe sharp pain to right shoulder 2 days and unable to move any direction without severe pain, hold arms close to body in a type of sling to support. Occurred after raking and lifting with recent yard work.  Nothing makes better or worse.  Unable to see Dr Tamala Julian for 1 wk due to scheduling.   Past Medical History  Diagnosis Date  . SINUSITIS- ACUTE-NOS 12/22/2009  . ALLERGIC RHINITIS 10/18/2008  . SHOULDER PAIN, LEFT 10/18/2010  . WRIST PAIN, RIGHT 10/18/2008  . KNEE PAIN, RIGHT 10/18/2008  . LOW BACK PAIN 10/18/2008  . LUMBAR RADICULOPATHY, RIGHT 06/28/2009  . BACK PAIN, UPPER 10/18/2008  . BACK PAIN, UPPER 10/18/2008  . UNSPECIFIED NEURALGIA NEURITIS AND RADICULITIS 10/18/2008  . INTERMITTENT VERTIGO 11/01/2009  . Dysuria 10/18/2010  . GERD (gastroesophageal reflux disease) 06/20/2011  . Seasonal allergies   . Allergy   . Blood transfusion without reported diagnosis    Past Surgical History  Procedure Laterality Date  . Tonsillectomy  1987  . Abdominal hysterectomy  05/2005    due to fibroids, still born, ovaries intact  . Colonoscopy  08-04-2009  . Cesarean section  1983  . Dilation and curettage of uterus  2006    reports that she has never smoked. She has never used smokeless tobacco. She reports that she does not drink alcohol or use illicit drugs. family history includes Arthritis in her father and mother; Colon cancer in her father and maternal aunt; Dementia in her father; Diabetes in her father; Hypertension in her brother, daughter, and mother; Kidney disease in her mother; Parkinsonism in her father. There is no history of Rectal cancer or Stomach cancer. Allergies  Allergen Reactions  . Sulfamethoxazole-Trimethoprim     REACTION: Faint   Current Outpatient Prescriptions on File Prior to Visit  Medication Sig Dispense Refill  . cyclobenzaprine  (FLEXERIL) 5 MG tablet Take 1 tablet (5 mg total) by mouth 3 (three) times daily as needed for muscle spasms. 60 tablet 2  . levocetirizine (XYZAL) 5 MG tablet TAKE 1 TABLET BY MOUTH EVERY EVENING 90 tablet 3  . pantoprazole (PROTONIX) 40 MG tablet TAKE 1 TABLET BY MOUTH EVERY DAY 30 tablet 6  . psyllium (METAMUCIL SMOOTH TEXTURE) 28 % packet Take 1 packet by mouth daily.    . famciclovir (FAMVIR) 500 MG tablet Take 1 tablet (500 mg total) by mouth 3 (three) times daily. (Patient not taking: Reported on 03/17/2015) 21 tablet 0  . gabapentin (NEURONTIN) 100 MG capsule One pill every eight hours as needed; dose may be increased by one pill each dose after 72 hours if only partially effective (Patient not taking: Reported on 03/17/2015) 30 capsule 2  . [DISCONTINUED] mometasone (NASONEX) 50 MCG/ACT nasal spray 2 sprays by Nasal route daily. 17 g 2   No current facility-administered medications on file prior to visit.   Review of Systems All otherwise neg per pt     Objective:   Physical Exam BP 132/86 mmHg  Pulse 92  Temp(Src) 98.4 F (36.9 C) (Oral)  Resp 18  Ht 5\' 4"  (1.626 m)  Wt 236 lb (107.049 kg)  BMI 40.49 kg/m2  SpO2 97% VS noted,  Constitutional: Pt appears in no significant distress HENT: Head: NCAT.  Right Ear: External ear normal.  Left Ear: External ear normal.  Eyes: .  Pupils are equal, round, and reactive to light. Conjunctivae and EOM are normal Neck: Normal range of motion. Neck supple.  Cardiovascular: Normal rate and regular rhythm.   Pulmonary/Chest: Effort normal and breath sounds without rales or wheezing.  Right shoulder with tender to post deltoid and tricep, as well as bicipital insertion site, but no tender to right subacromial, also with severe pain with any attempted movement few degrees only with forward elevation or abduction Neurological: Pt is alert. Not confused , motor grossly intact Skin: Skin is warm. No rash, no LE edema Psychiatric: Pt behavior is  normal. No agitation.     Assessment & Plan:

## 2015-03-17 NOTE — Patient Instructions (Signed)
Please take all new medication as prescribed - the pain medication  Please continue all other medications as before, and refills have been done if requested.  Please have the pharmacy call with any other refills you may need.  Please keep your appointments with your specialists as you may have planned  You will be contacted regarding the referral for: orthopedic (to see [facility] now)

## 2015-03-17 NOTE — Progress Notes (Signed)
Pre visit review using our clinic review tool, if applicable. No additional management support is needed unless otherwise documented below in the visit note. 

## 2015-03-19 NOTE — Assessment & Plan Note (Signed)
Severe persistent, cant r/o acute rot cuff tear, for urgent ortho referral,  Pain control, to f/u any worsening symptoms or concerns

## 2015-03-24 ENCOUNTER — Ambulatory Visit: Payer: 59 | Admitting: Family Medicine

## 2015-08-07 ENCOUNTER — Emergency Department (INDEPENDENT_AMBULATORY_CARE_PROVIDER_SITE_OTHER)
Admission: EM | Admit: 2015-08-07 | Discharge: 2015-08-07 | Disposition: A | Discharge status: Home / Self Care | Payer: Self-pay | Source: Home / Self Care | Attending: Family Medicine | Admitting: Family Medicine

## 2015-08-07 ENCOUNTER — Encounter (HOSPITAL_COMMUNITY): Payer: Self-pay | Admitting: Emergency Medicine

## 2015-08-07 DIAGNOSIS — J300 Vasomotor rhinitis: Secondary | ICD-10-CM

## 2015-08-07 LAB — POCT I-STAT, CHEM 8
BUN: 8 mg/dL (ref 6–20)
Calcium, Ion: 1.19 mmol/L (ref 1.12–1.23)
Chloride: 105 mmol/L (ref 101–111)
Creatinine, Ser: 0.8 mg/dL (ref 0.44–1.00)
Glucose, Bld: 115 mg/dL — ABNORMAL HIGH (ref 65–99)
HCT: 44 % (ref 36.0–46.0)
Hemoglobin: 15 g/dL (ref 12.0–15.0)
Potassium: 4.1 mmol/L (ref 3.5–5.1)
Sodium: 143 mmol/L (ref 135–145)
TCO2: 25 mmol/L (ref 0–100)

## 2015-08-07 LAB — POCT URINALYSIS DIP (DEVICE)
Bilirubin Urine: NEGATIVE
Glucose, UA: NEGATIVE mg/dL
Ketones, ur: NEGATIVE mg/dL
Leukocytes, UA: NEGATIVE
Nitrite: NEGATIVE
Protein, ur: NEGATIVE mg/dL
Specific Gravity, Urine: 1.015 (ref 1.005–1.030)
Urobilinogen, UA: 0.2 mg/dL (ref 0.0–1.0)
pH: 7 (ref 5.0–8.0)

## 2015-08-07 MED ORDER — IPRATROPIUM BROMIDE 0.06 % NA SOLN: 2.0000 | Freq: Four times a day (QID) | NASAL | Status: DC

## 2015-08-07 NOTE — ED Notes (Signed)
All week has felt sluggish.  Has a cold sore on mouth.  Today at church felt like she would pass out.  Drank juice and ate, still not feeling good.  Loose stools today.  Has had a dry cough, stuffy head for a week.  No fever.  And c/o frequent urination

## 2015-08-07 NOTE — ED Provider Notes (Signed)
CSN: [id number]     Arrival date & time 08/07/15  1303 History   First MD Initiated Contact with Patient 08/07/15 1400     No chief complaint on file.  (Consider location/radiation/quality/duration/timing/severity/associated sxs/prior Treatment) Patient is a 54 y.o. female presenting with URI.  URI Presenting symptoms: congestion, cough and rhinorrhea   Presenting symptoms: no fever   Severity:  Moderate Onset quality:  Gradual Duration:  1 week Progression:  Unchanged Chronicity:  New Relieved by:  None tried Worsened by:  Nothing tried Ineffective treatments:  None tried Associated symptoms: no wheezing   Risk factors comment:  Using a/c freq.   Past Medical History  Diagnosis Date  . SINUSITIS- ACUTE-NOS 12/22/2009  . ALLERGIC RHINITIS 10/18/2008  . SHOULDER PAIN, LEFT 10/18/2010  . WRIST PAIN, RIGHT 10/18/2008  . KNEE PAIN, RIGHT 10/18/2008  . LOW BACK PAIN 10/18/2008  . LUMBAR RADICULOPATHY, RIGHT 06/28/2009  . BACK PAIN, UPPER 10/18/2008  . BACK PAIN, UPPER 10/18/2008  . UNSPECIFIED NEURALGIA NEURITIS AND RADICULITIS 10/18/2008  . INTERMITTENT VERTIGO 11/01/2009  . Dysuria 10/18/2010  . GERD (gastroesophageal reflux disease) 06/20/2011  . Seasonal allergies   . Allergy   . Blood transfusion without reported diagnosis    Past Surgical History  Procedure Laterality Date  . Tonsillectomy  1987  . Abdominal hysterectomy  05/2005    due to fibroids, still born, ovaries intact  . Colonoscopy  08-04-2009  . Cesarean section  1983  . Dilation and curettage of uterus  2006   Family History  Problem Relation Age of Onset  . Arthritis Mother   . Kidney disease Mother   . Hypertension Mother   . Arthritis Father   . Diabetes Father   . Parkinsonism Father   . Dementia Father   . Colon cancer Father   . Hypertension Brother   . Hypertension Daughter   . Colon cancer Maternal Aunt   . Rectal cancer Neg Hx   . Stomach cancer Neg Hx    Social History  Substance Use Topics   . Smoking status: Never Smoker   . Smokeless tobacco: Never Used  . Alcohol Use: No   OB History    No data available     Review of Systems  Constitutional: Negative.  Negative for fever and appetite change.  HENT: Positive for congestion, postnasal drip and rhinorrhea.   Respiratory: Positive for cough. Negative for shortness of breath and wheezing.     Allergies  Sulfamethoxazole-trimethoprim  Home Medications   Prior to Admission medications   Medication Sig Start Date End Date Taking? Authorizing Provider  levocetirizine (XYZAL) 5 MG tablet TAKE 1 TABLET BY MOUTH EVERY EVENING 10/25/14  Yes Biagio Borg, MD  cyclobenzaprine (FLEXERIL) 5 MG tablet Take 1 tablet (5 mg total) by mouth 3 (three) times daily as needed for muscle spasms. 01/25/15   Biagio Borg, MD  famciclovir (FAMVIR) 500 MG tablet Take 1 tablet (500 mg total) by mouth 3 (three) times daily. Patient not taking: Reported on 03/17/2015 12/25/14   Hendricks Limes, MD  gabapentin (NEURONTIN) 100 MG capsule One pill every eight hours as needed; dose may be increased by one pill each dose after 72 hours if only partially effective Patient not taking: Reported on 03/17/2015 12/25/14   Hendricks Limes, MD  ipratropium (ATROVENT) 0.06 % nasal spray Place 2 sprays into both nostrils 4 (four) times daily. 08/07/15   Billy Fischer, MD  oxyCODONE (OXY IR/ROXICODONE) 5 MG immediate release  tablet Take 1 tablet (5 mg total) by mouth every 4 (four) hours as needed for severe pain. 03/17/15   Biagio Borg, MD  pantoprazole (PROTONIX) 40 MG tablet TAKE 1 TABLET BY MOUTH EVERY DAY 10/18/13   Biagio Borg, MD  psyllium (METAMUCIL SMOOTH TEXTURE) 28 % packet Take 1 packet by mouth daily.    Historical Provider, MD   Meds Ordered and Administered this Visit  Medications - No data to display  BP 121/66 mmHg  Pulse 79  Temp(Src) 98.4 F (36.9 C) (Oral)  Resp 18  SpO2 99% No data found.   Physical Exam  Constitutional: She is oriented  to person, place, and time. She appears well-developed and well-nourished. No distress.  HENT:  Head: Normocephalic.  Right Ear: External ear normal.  Left Ear: External ear normal.  Mouth/Throat: Oropharynx is clear and moist.  Eyes: Pupils are equal, round, and reactive to light.  Neck: Normal range of motion. Neck supple.  Cardiovascular: Normal heart sounds and intact distal pulses.   Pulmonary/Chest: Effort normal and breath sounds normal.  Neurological: She is alert and oriented to person, place, and time.  Skin: Skin is warm and dry.  Nursing note and vitals reviewed.   ED Course  Procedures (including critical care time)  Labs Review Labs Reviewed  POCT URINALYSIS DIP (DEVICE) - Abnormal; Notable for the following:    Hgb urine dipstick TRACE (*)    All other components within normal limits  POCT I-STAT, CHEM 8 - Abnormal; Notable for the following:    Glucose, Bld 115 (*)    All other components within normal limits   i-stat bs 115 Imaging Review No results found.   Visual Acuity Review  Right Eye Distance:   Left Eye Distance:   Bilateral Distance:    Right Eye Near:   Left Eye Near:    Bilateral Near:         MDM   1. Vasomotor rhinitis        Billy Fischer, MD 08/07/15 2049

## 2015-08-07 NOTE — Discharge Instructions (Signed)
Drink plenty of fluids, use medicine as prescribed, avoid a/c and fans, see dr Jenny Reichmann as needed

## 2015-12-26 ENCOUNTER — Ambulatory Visit (INDEPENDENT_AMBULATORY_CARE_PROVIDER_SITE_OTHER)
Admission: RE | Admit: 2015-12-26 | Discharge: 2015-12-26 | Disposition: A | Discharge status: Home / Self Care | Payer: BLUE CROSS/BLUE SHIELD | Source: Ambulatory Visit | Attending: Internal Medicine | Admitting: Internal Medicine

## 2015-12-26 ENCOUNTER — Ambulatory Visit (INDEPENDENT_AMBULATORY_CARE_PROVIDER_SITE_OTHER): Payer: BLUE CROSS/BLUE SHIELD | Admitting: Internal Medicine

## 2015-12-26 ENCOUNTER — Encounter: Payer: Self-pay | Admitting: Internal Medicine

## 2015-12-26 ENCOUNTER — Other Ambulatory Visit (INDEPENDENT_AMBULATORY_CARE_PROVIDER_SITE_OTHER): Payer: BLUE CROSS/BLUE SHIELD

## 2015-12-26 VITALS — BP 128/76 | HR 81 | Temp 98.2°F | Resp 20 | Ht 64.0 in | Wt 236.0 lb

## 2015-12-26 DIAGNOSIS — Z0000 Encounter for general adult medical examination without abnormal findings: Secondary | ICD-10-CM | POA: Diagnosis not present

## 2015-12-26 DIAGNOSIS — J3489 Other specified disorders of nose and nasal sinuses: Secondary | ICD-10-CM | POA: Insufficient documentation

## 2015-12-26 DIAGNOSIS — R599 Enlarged lymph nodes, unspecified: Secondary | ICD-10-CM | POA: Diagnosis not present

## 2015-12-26 DIAGNOSIS — Z23 Encounter for immunization: Secondary | ICD-10-CM

## 2015-12-26 DIAGNOSIS — R590 Localized enlarged lymph nodes: Secondary | ICD-10-CM

## 2015-12-26 DIAGNOSIS — J309 Allergic rhinitis, unspecified: Secondary | ICD-10-CM | POA: Diagnosis not present

## 2015-12-26 DIAGNOSIS — M25511 Pain in right shoulder: Secondary | ICD-10-CM | POA: Diagnosis not present

## 2015-12-26 LAB — LIPID PANEL
Cholesterol: 164 mg/dL (ref 0–200)
HDL: 54.7 mg/dL (ref >39.00)
LDL Cholesterol: 95 mg/dL (ref 0–99)
NonHDL: 109.12
Total CHOL/HDL Ratio: 3
Triglycerides: 72 mg/dL (ref 0.0–149.0)
VLDL: 14.4 mg/dL (ref 0.0–40.0)

## 2015-12-26 LAB — CBC WITH DIFFERENTIAL/PLATELET
Basophils Absolute: 0.1 10*3/uL (ref 0.0–0.1)
Basophils Relative: 1 % (ref 0.0–3.0)
Eosinophils Absolute: 0.2 10*3/uL (ref 0.0–0.7)
Eosinophils Relative: 3 % (ref 0.0–5.0)
HCT: 38 % (ref 36.0–46.0)
Hemoglobin: 12.5 g/dL (ref 12.0–15.0)
Lymphocytes Relative: 28.3 % (ref 12.0–46.0)
Lymphs Abs: 1.9 10*3/uL (ref 0.7–4.0)
MCHC: 32.9 g/dL (ref 30.0–36.0)
MCV: 90.8 fl (ref 78.0–100.0)
Monocytes Absolute: 0.7 10*3/uL (ref 0.1–1.0)
Monocytes Relative: 9.8 % (ref 3.0–12.0)
Neutro Abs: 3.9 10*3/uL (ref 1.4–7.7)
Neutrophils Relative %: 57.9 % (ref 43.0–77.0)
Platelets: 261 10*3/uL (ref 150.0–400.0)
RBC: 4.19 Mil/uL (ref 3.87–5.11)
RDW: 13.2 % (ref 11.5–15.5)
WBC: 6.8 10*3/uL (ref 4.0–10.5)

## 2015-12-26 LAB — BASIC METABOLIC PANEL
BUN: 15 mg/dL (ref 6–23)
CO2: 28 mEq/L (ref 19–32)
Calcium: 9.7 mg/dL (ref 8.4–10.5)
Chloride: 105 mEq/L (ref 96–112)
Creatinine, Ser: 0.79 mg/dL (ref 0.40–1.20)
GFR: 96.91 mL/min (ref >60.00)
Glucose, Bld: 86 mg/dL (ref 70–99)
Potassium: 4.2 mEq/L (ref 3.5–5.1)
Sodium: 143 mEq/L (ref 135–145)

## 2015-12-26 LAB — HEPATIC FUNCTION PANEL
ALT: 15 U/L (ref 0–35)
AST: 15 U/L (ref 0–37)
Albumin: 4.3 g/dL (ref 3.5–5.2)
Alkaline Phosphatase: 71 U/L (ref 39–117)
Bilirubin, Direct: 0 mg/dL (ref 0.0–0.3)
Total Bilirubin: 0.3 mg/dL (ref 0.2–1.2)
Total Protein: 8.1 g/dL (ref 6.0–8.3)

## 2015-12-26 LAB — TSH: TSH: 2.14 u[IU]/mL (ref 0.35–4.50)

## 2015-12-26 MED ORDER — ASPIRIN EC 81 MG PO TBEC: 81.0000 mg | DELAYED_RELEASE_TABLET | Freq: Every day | ORAL | Status: DC

## 2015-12-26 MED ORDER — TRIAMCINOLONE ACETONIDE 55 MCG/ACT NA AERO: 2.0000 | INHALATION_SPRAY | Freq: Every day | NASAL | Status: DC

## 2015-12-26 MED ORDER — MUPIROCIN 2 % EX OINT: 1.0000 "application " | TOPICAL_OINTMENT | Freq: Two times a day (BID) | CUTANEOUS | Status: DC

## 2015-12-26 NOTE — Assessment & Plan Note (Signed)
Suspect benign, for cxr, consider ENT if persists or worsens/declines today

## 2015-12-26 NOTE — Progress Notes (Signed)
Pre visit review using our clinic review tool, if applicable. No additional management support is needed unless otherwise documented below in the visit note. 

## 2015-12-26 NOTE — Assessment & Plan Note (Signed)

## 2015-12-26 NOTE — Assessment & Plan Note (Signed)
With reduced ROM, possible rot cuff dz, for refer Sport med,  to f/u any worsening symptoms or concerns

## 2015-12-26 NOTE — Patient Instructions (Addendum)
You had the flu shot today  Please take all new medication as prescribed - the antibiotic for the nose, and the nasacort for allergies after the antibiotic is done  Please continue all other medications as before, and refills have been done if requested.  Please have the pharmacy call with any other refills you may need.  Please continue your efforts at being more active, low cholesterol diet, and weight control.  You will be contacted regarding the referral for: Dr Smith/sport medicine  You are otherwise up to date with prevention measures today.  Please keep your appointments with your specialists as you may have planned  Please go to the XRAY Department in the Basement (go straight as you get off the elevator) for the x-ray testing  You will be contacted by phone if any changes need to be made immediately.  Otherwise, you will receive a letter about your results with an explanation, but please check with MyChart first.  Please remember to sign up for MyChart if you have not done so, as this will be important to you in the future with finding out test results, communicating by private email, and scheduling acute appointments online when needed.  Please return in 1 year for your yearly visit, or sooner if needed, with Lab testing done 3-5 days before

## 2015-12-26 NOTE — Progress Notes (Signed)
Subjective:    Patient ID: Melody Lewis, female    DOB: 03/30/1961, 55 y.o.   MRN: [id number]  HPI  Here for wellness and f/u;  Overall doing ok;  Pt denies Chest pain, worsening SOB, DOE, wheezing, orthopnea, PND, worsening LE edema, palpitations, dizziness or syncope.  Pt denies neurological change such as new headache, facial or extremity weakness.  Pt denies polydipsia, polyuria, or low sugar symptoms. Pt states overall good compliance with treatment and medications, good tolerability, and has been trying to follow appropriate diet.  Pt denies worsening depressive symptoms, suicidal ideation or panic. No fever, night sweats, wt loss, loss of appetite, or other constitutional symptoms.  Pt states good ability with ADL's, has low fall risk, home safety reviewed and adequate, no other significant changes in hearing or vision, and only occasionally active with exercise.  Due for flu shot. Wants to start asa 53m qd. Does have several wks ongoing nasal allergy symptoms with clearish congestion, itch and sneezing, without fever, pain, ST, cough, swelling or wheezing.despite the xyal,  but also has recurring sores to both nares inside. Seen recently per ortho murphy wainer with right shoulder pain, reduced ROM, and despite cortisone still cannot raise overhead.  Asks for Dr Smith/sport med referral.  Also with small lumps to right supraclavicualr area that seem to get large and smaller for several months. Past Medical History  Diagnosis Date  . SINUSITIS- ACUTE-NOS 12/22/2009  . ALLERGIC RHINITIS 10/18/2008  . SHOULDER PAIN, LEFT 10/18/2010  . WRIST PAIN, RIGHT 10/18/2008  . KNEE PAIN, RIGHT 10/18/2008  . LOW BACK PAIN 10/18/2008  . LUMBAR RADICULOPATHY, RIGHT 06/28/2009  . BACK PAIN, UPPER 10/18/2008  . BACK PAIN, UPPER 10/18/2008  . UNSPECIFIED NEURALGIA NEURITIS AND RADICULITIS 10/18/2008  . INTERMITTENT VERTIGO 11/01/2009  . Dysuria 10/18/2010  . GERD (gastroesophageal reflux disease) 06/20/2011  .  Seasonal allergies   . Allergy   . Blood transfusion without reported diagnosis    Past Surgical History  Procedure Laterality Date  . Tonsillectomy  1987  . Abdominal hysterectomy  05/2005    due to fibroids, still born, ovaries intact  . Colonoscopy  08-04-2009  . Cesarean section  1983  . Dilation and curettage of uterus  2006    reports that she has never smoked. She has never used smokeless tobacco. She reports that she does not drink alcohol or use illicit drugs. family history includes Arthritis in her father and mother; Colon cancer in her father and maternal aunt; Dementia in her father; Diabetes in her father; Hypertension in her brother, daughter, and mother; Kidney disease in her mother; Parkinsonism in her father. There is no history of Rectal cancer or Stomach cancer. Allergies  Allergen Reactions  . Sulfamethoxazole-Trimethoprim     REACTION: Faint   Current Outpatient Prescriptions on File Prior to Visit  Medication Sig Dispense Refill  . cyclobenzaprine (FLEXERIL) 5 MG tablet Take 1 tablet (5 mg total) by mouth 3 (three) times daily as needed for muscle spasms. 60 tablet 2  . famciclovir (FAMVIR) 500 MG tablet Take 1 tablet (500 mg total) by mouth 3 (three) times daily. 21 tablet 0  . gabapentin (NEURONTIN) 100 MG capsule One pill every eight hours as needed; dose may be increased by one pill each dose after 72 hours if only partially effective 30 capsule 2  . levocetirizine (XYZAL) 5 MG tablet TAKE 1 TABLET BY MOUTH EVERY EVENING 90 tablet 3  . psyllium (METAMUCIL SMOOTH TEXTURE) 28 % packet  Take 1 packet by mouth daily.    . [DISCONTINUED] mometasone (NASONEX) 50 MCG/ACT nasal spray 2 sprays by Nasal route daily. 17 g 2   No current facility-administered medications on file prior to visit.    Review of Systems Constitutional: Negative for increased diaphoresis, other activity, appetite or siginficant weight change other than noted HENT: Negative for worsening  hearing loss, ear pain, facial swelling, mouth sores and neck stiffness.   Eyes: Negative for other worsening pain, redness or visual disturbance.  Respiratory: Negative for shortness of breath and wheezing  Cardiovascular: Negative for chest pain and palpitations.  Gastrointestinal: Negative for diarrhea, blood in stool, abdominal distention or other pain Genitourinary: Negative for hematuria, flank pain or change in urine volume.  Musculoskeletal: Negative for myalgias or other joint complaints.  Skin: Negative for color change and wound or drainage.  Neurological: Negative for syncope and numbness. other than noted Hematological: Negative for adenopathy. or other swelling Psychiatric/Behavioral: Negative for hallucinations, SI, self-injury, decreased concentration or other worsening agitation.      Objective:   Physical Exam BP 128/76 mmHg  Pulse 81  Temp(Src) 98.2 F (36.8 C) (Oral)  Resp 20  Ht 5\' 4"  (1.626 m)  Wt 236 lb (107.049 kg)  BMI 40.49 kg/m2  SpO2 96% VS noted,  Constitutional: Pt is oriented to person, place, and time. Appears well-developed and well-nourished, in no significant distress Head: Normocephalic and atraumatic.  Right Ear: External ear normal.  Left Ear: External ear normal.  Nose: Nose normal.  Mouth/Throat: Oropharynx is clear and moist.  Eyes: Conjunctivae and EOM are normal. Pupils are equal, round, and reactive to light.  Neck: Normal range of motion. Neck supple. No JVD present. No tracheal deviation present or significant neck LA or mass Cardiovascular: Normal rate, regular rhythm, normal heart sounds and intact distal pulses.   Pulmonary/Chest: Effort normal and breath sounds without rales or wheezing  Abdominal: Soft. Bowel sounds are normal. NT. No HSM  Musculoskeletal: Normal range of motion. Exhibits no edema.  Lymphadenopathy:  Has no cervical adenopathy. but does have 2-3 < 1 cm likely right supraclavicular LN, NT, firm but not hard, and  mobile Neurological: Pt is alert and oriented to person, place, and time. Pt has normal reflexes. No cranial nerve deficit. Motor grossly intact Skin: Skin is warm and dry. No rash noted.  Psychiatric:  Has normal mood and affect. Behavior is normal.  Right shoulder with decreased ROM to 90 degrees only    Assessment & Plan:

## 2015-12-26 NOTE — Assessment & Plan Note (Signed)
Mild to mod, for antibx course - mupirocin,  to f/u any worsening symptoms or concerns

## 2015-12-26 NOTE — Assessment & Plan Note (Signed)
Mild to mod, for add nasacort asd,  to f/u any worsening symptoms or concerns 

## 2015-12-27 LAB — HEPATITIS C ANTIBODY: HCV Ab: NEGATIVE

## 2016-01-03 ENCOUNTER — Encounter: Payer: Self-pay | Admitting: Family Medicine

## 2016-01-03 ENCOUNTER — Ambulatory Visit (INDEPENDENT_AMBULATORY_CARE_PROVIDER_SITE_OTHER): Payer: BLUE CROSS/BLUE SHIELD | Admitting: Family Medicine

## 2016-01-03 ENCOUNTER — Other Ambulatory Visit (INDEPENDENT_AMBULATORY_CARE_PROVIDER_SITE_OTHER): Payer: BLUE CROSS/BLUE SHIELD

## 2016-01-03 VITALS — BP 138/82 | HR 101 | Ht 64.0 in | Wt 234.0 lb

## 2016-01-03 DIAGNOSIS — M7500 Adhesive capsulitis of unspecified shoulder: Secondary | ICD-10-CM | POA: Insufficient documentation

## 2016-01-03 DIAGNOSIS — M25511 Pain in right shoulder: Secondary | ICD-10-CM

## 2016-01-03 DIAGNOSIS — M7501 Adhesive capsulitis of right shoulder: Secondary | ICD-10-CM

## 2016-01-03 MED ORDER — DICLOFENAC SODIUM 2 % TD SOLN: 2.0000 "application " | Freq: Two times a day (BID) | TRANSDERMAL | Status: DC

## 2016-01-03 NOTE — Assessment & Plan Note (Signed)
Patient is more of a frozen shoulder syndrome. We discussed with patient at great length. I do believe the numbness as well as decreasing her range of motion at this time. Do not feel that further imaging is necessary at this time. We discussed topical anti-inflammatories, vitamin D supplementation, home exercises and patient work with Product/process development scientist. Patient will come back again in 3 weeks. Not having as much improvement I would like to get x-rays likely do an ultrasound and possible injection. Patient may also be a candidate for formal physical therapy.

## 2016-01-03 NOTE — Progress Notes (Signed)
Pre visit review using our clinic review tool, if applicable. No additional management support is needed unless otherwise documented below in the visit note. 

## 2016-01-03 NOTE — Patient Instructions (Signed)
Good to see you.  Ice 20 minutes 2 times daily. Usually after activity and before bed. Exercises close to daily Keep moving it! pennsaid pinkie amount topically 2 times daily as needed. Call the pharmacy Over the counter you need... Vitamin D 4000 IU daily for 1 month then 2000 IU daily thereafter Turmeric 500mg  twice daily  Watch for stomach discomfort See me again in 3 weeks and we will make sure we are making progress

## 2016-01-03 NOTE — Progress Notes (Signed)
[facility] [place],  [address] Phone: [phone] Subjective:    I'm seeing this patient by the request  of:  Cathlean Cower, MD   CC: Right shoulder pain  [id number] Melody Lewis is a 55 y.o. female coming in with complaint of right shoulder pain. Patient states that she's been having this pain for 6-7 months. Was seen by another provider and was given an injection 5 months ago. States that it didn't help but started to loosen range of motion. Patient states that certain activities such as even dressing herself has become somewhat challenging. States that the pain is only with certain movements it only lasts seconds. States though that the pain can wake her up at night as well. Describes the pain as a dull, throbbing aching sensation. No radiation down the arm and no weakness. Rates the severity of 6 out of 10. Does not seem to be responding to over-the-counter medications. Has not tried any other home modalities such as ice or heat. States that the muscle relaxer that she has been given helps minorly.     Past Medical History  Diagnosis Date  . SINUSITIS- ACUTE-NOS 12/22/2009  . ALLERGIC RHINITIS 10/18/2008  . SHOULDER PAIN, LEFT 10/18/2010  . WRIST PAIN, RIGHT 10/18/2008  . KNEE PAIN, RIGHT 10/18/2008  . LOW BACK PAIN 10/18/2008  . LUMBAR RADICULOPATHY, RIGHT 06/28/2009  . BACK PAIN, UPPER 10/18/2008  . BACK PAIN, UPPER 10/18/2008  . UNSPECIFIED NEURALGIA NEURITIS AND RADICULITIS 10/18/2008  . INTERMITTENT VERTIGO 11/01/2009  . Dysuria 10/18/2010  . GERD (gastroesophageal reflux disease) 06/20/2011  . Seasonal allergies   . Allergy   . Blood transfusion without reported diagnosis    Past Surgical History  Procedure Laterality Date  . Tonsillectomy  1987  . Abdominal hysterectomy  05/2005    due to fibroids, still born, ovaries intact  . Colonoscopy  08-04-2009  . Cesarean section  1983  . Dilation and curettage of uterus  2006    Social History   Social History  . Marital Status: Married    Spouse Name: N/A  . Number of Children: N/A  . Years of Education: N/A   Social History Main Topics  . Smoking status: Never Smoker   . Smokeless tobacco: Never Used  . Alcohol Use: No  . Drug Use: No  . Sexual Activity: Not Asked   Other Topics Concern  . None   Social History Narrative   Allergies  Allergen Reactions  . Sulfamethoxazole-Trimethoprim     REACTION: Faint   Family History  Problem Relation Age of Onset  . Arthritis Mother   . Kidney disease Mother   . Hypertension Mother   . Arthritis Father   . Diabetes Father   . Parkinsonism Father   . Dementia Father   . Colon cancer Father   . Hypertension Brother   . Hypertension Daughter   . Colon cancer Maternal Aunt   . Rectal cancer Neg Hx   . Stomach cancer Neg Hx     Past medical history, social, surgical and family history all reviewed in electronic medical record.  No pertanent information unless stated regarding to the chief complaint.   Review of Systems: No headache, visual changes, nausea, vomiting, diarrhea, constipation, dizziness, abdominal pain, skin rash, fevers, chills, night sweats, weight loss, swollen lymph nodes, body aches, joint swelling, muscle aches, chest pain, shortness of breath, mood changes.   Objective Blood pressure 138/82, pulse 101, height 5'  4" (1.626 m), weight 234 lb (106.142 kg), SpO2 98 %.  General: No apparent distress alert and oriented x3 mood and affect normal, dressed appropriately.  HEENT: Pupils equal, extraocular movements intact  Respiratory: Patient's speak in full sentences and does not appear short of breath  Cardiovascular: No lower extremity edema, non tender, no erythema  Skin: Warm dry intact with no signs of infection or rash on extremities or on axial skeleton.  Abdomen: Soft nontender  Neuro: Cranial nerves II through XII are intact, neurovascularly intact in all extremities with 2+  DTRs and 2+ pulses.  Lymph: No lymphadenopathy of posterior or anterior cervical chain or axillae bilaterally.  Gait normal with good balance and coordination.  MSK:  Non tender with full range of motion and good stability and symmetric strength and tone of elbows, wrist, hip, knee and ankles bilaterally.  Shoulder: Right Inspection reveals no abnormalities, atrophy or asymmetry. Palpation is normal with no tenderness over AC joint or bicipital groove. Patient does have full external range of motion of the shoulder but patient has internal range of motion to sacrum in forward flexion 140 Rotator cuff strength normal throughout. Really positive impingement noted Speeds and Yergason's tests normal. No labral pathology noted with negative Obrien's, negative clunk and good stability. Normal scapular function observed. No painful arc and no drop arm sign. No apprehension sign Contralateral shoulder unremarkable  Procedure note 97110; 15 minutes spent for Therapeutic exercises as stated in above notes.  This included exercises focusing on stretching, strengthening, with significant focus on eccentric aspects. Basic scapular stabilization to include adduction and depression of scapula Scaption, focusing on proper movement and good control Internal and External rotation utilizing a theraband, with elbow tucked at side entire time Rows with theraband  Proper technique shown and discussed handout in great detail with ATC.  All questions were discussed and answered.      Impression and Recommendations:     This case required medical decision making of moderate complexity.      Note: This dictation was prepared with Dragon dictation along with smaller phrase technology. Any transcriptional errors that result from this process are unintentional.

## 2016-01-13 ENCOUNTER — Encounter: Payer: Self-pay | Admitting: Internal Medicine

## 2016-01-13 MED ORDER — LEVOCETIRIZINE DIHYDROCHLORIDE 5 MG PO TABS: 5.0000 mg | ORAL_TABLET | Freq: Every evening | ORAL | Status: DC

## 2016-01-25 ENCOUNTER — Ambulatory Visit (INDEPENDENT_AMBULATORY_CARE_PROVIDER_SITE_OTHER): Payer: BLUE CROSS/BLUE SHIELD | Admitting: Family Medicine

## 2016-01-25 ENCOUNTER — Encounter: Payer: Self-pay | Admitting: Family Medicine

## 2016-01-25 VITALS — BP 132/82 | HR 82 | Wt 223.0 lb

## 2016-01-25 DIAGNOSIS — M7501 Adhesive capsulitis of right shoulder: Secondary | ICD-10-CM

## 2016-01-25 NOTE — Patient Instructions (Signed)
Good to see you  Ice is your friend Continue the pennsaid Duexis 3 times a day for 3 days  PT will be calling you  See me again in 4 weeks and if not better then we will consider another injection.

## 2016-01-25 NOTE — Progress Notes (Signed)
[name] [place], [place] [address] Phone: [phone] Subjective:    I'm seeing this patient by the request  of:  Cathlean Cower, MD   CC: Right shoulder pain  [id number] Melody Lewis is a 54 y.o. female coming in with complaint of right shoulder pain. Patient was seen previously and was diagnosed with more of a frozen shoulder syndrome. Was given an injection. An states that she is doing approximately 85% better. Patient was even doing better but then unfortunately her nephew recently passed away. Patient has had to help out with her sister. Has not been doing the exercises regularly and is noticing some mild increase in stiffness as well as pain at night. Patient overall though is motivated to get better. States that it's becoming much more easy to do daily activities.     Past Medical History  Diagnosis Date  . SINUSITIS- ACUTE-NOS 12/22/2009  . ALLERGIC RHINITIS 10/18/2008  . SHOULDER PAIN, LEFT 10/18/2010  . WRIST PAIN, RIGHT 10/18/2008  . KNEE PAIN, RIGHT 10/18/2008  . LOW BACK PAIN 10/18/2008  . LUMBAR RADICULOPATHY, RIGHT 06/28/2009  . BACK PAIN, UPPER 10/18/2008  . BACK PAIN, UPPER 10/18/2008  . UNSPECIFIED NEURALGIA NEURITIS AND RADICULITIS 10/18/2008  . INTERMITTENT VERTIGO 11/01/2009  . Dysuria 10/18/2010  . GERD (gastroesophageal reflux disease) 06/20/2011  . Seasonal allergies   . Allergy   . Blood transfusion without reported diagnosis    Past Surgical History  Procedure Laterality Date  . Tonsillectomy  1987  . Abdominal hysterectomy  05/2005    due to fibroids, still born, ovaries intact  . Colonoscopy  08-04-2009  . Cesarean section  1983  . Dilation and curettage of uterus  2006   Social History   Social History  . Marital Status: Married    Spouse Name: N/A  . Number of Children: N/A  . Years of Education: N/A   Social History Main Topics  . Smoking status: Never Smoker   . Smokeless tobacco: Never Used  .  Alcohol Use: No  . Drug Use: No  . Sexual Activity: Not Asked   Other Topics Concern  . None   Social History Narrative   Allergies  Allergen Reactions  . Sulfamethoxazole-Trimethoprim     REACTION: Faint   Family History  Problem Relation Age of Onset  . Arthritis Mother   . Kidney disease Mother   . Hypertension Mother   . Arthritis Father   . Diabetes Father   . Parkinsonism Father   . Dementia Father   . Colon cancer Father   . Hypertension Brother   . Hypertension Daughter   . Colon cancer Maternal Aunt   . Rectal cancer Neg Hx   . Stomach cancer Neg Hx     Past medical history, social, surgical and family history all reviewed in electronic medical record.  No pertanent information unless stated regarding to the chief complaint.   Review of Systems: No headache, visual changes, nausea, vomiting, diarrhea, constipation, dizziness, abdominal pain, skin rash, fevers, chills, night sweats, weight loss, swollen lymph nodes, body aches, joint swelling, muscle aches, chest pain, shortness of breath, mood changes.   Objective Blood pressure 132/82, pulse 82, weight 223 lb (101.152 kg), SpO2 99 %.  General: No apparent distress alert and oriented x3 mood and affect normal, dressed appropriately.  HEENT: Pupils equal, extraocular movements intact  Respiratory: Patient's speak in full sentences and does not appear short of breath  Cardiovascular: No  lower extremity edema, non tender, no erythema  Skin: Warm dry intact with no signs of infection or rash on extremities or on axial skeleton.  Abdomen: Soft nontender  Neuro: Cranial nerves II through XII are intact, neurovascularly intact in all extremities with 2+ DTRs and 2+ pulses.  Lymph: No lymphadenopathy of posterior or anterior cervical chain or axillae bilaterally.  Gait normal with good balance and coordination.  MSK:  Non tender with full range of motion and good stability and symmetric strength and tone of elbows,  wrist, hip, knee and ankles bilaterally.  Shoulder: Right Inspection reveals no abnormalities, atrophy or asymmetry. Palpation is normal with no tenderness over AC joint or bicipital groove. Patient does have full external range of motion of the shoulder but patient has internal range of motion to sacrum in forward flexion 160 Rotator cuff strength normal throughout. Continued impingement but mild improvement Speeds and Yergason's tests normal. No labral pathology noted with negative Obrien's, negative clunk and good stability. Normal scapular function observed. No painful arc and no drop arm sign. No apprehension sign Contralateral shoulder unremarkable       Impression and Recommendations:     This case required medical decision making of moderate complexity.      Note: This dictation was prepared with Dragon dictation along with smaller phrase technology. Any transcriptional errors that result from this process are unintentional.

## 2016-01-25 NOTE — Assessment & Plan Note (Signed)
Patient overall is doing well. I do think that she could be pain-free within the next 6 weeks. Patient is having difficulty finding the motivation to stay active. Patient is going to start with formal physical therapy was referred today. Patient has topical anti-inflammatories. His short course of anti-inflammatories to take 3 times daily for the next 3 days. Patient will continue with icing protocol. Will follow-up again in 4-6 weeks for further evaluation and treatment. If worsening symptoms we will have patient possibly had a repeat injection.  Spent  25 minutes with patient face-to-face and had greater than 50% of counseling including as described above in assessment and plan.

## 2016-01-25 NOTE — Progress Notes (Signed)
Pre visit review using our clinic review tool, if applicable. No additional management support is needed unless otherwise documented below in the visit note. 

## 2016-02-09 ENCOUNTER — Ambulatory Visit: Payer: BLUE CROSS/BLUE SHIELD | Attending: Family Medicine

## 2016-02-09 DIAGNOSIS — R293 Abnormal posture: Secondary | ICD-10-CM | POA: Diagnosis present

## 2016-02-09 DIAGNOSIS — M25611 Stiffness of right shoulder, not elsewhere classified: Secondary | ICD-10-CM

## 2016-02-09 DIAGNOSIS — M25511 Pain in right shoulder: Secondary | ICD-10-CM | POA: Diagnosis not present

## 2016-02-09 NOTE — Therapy (Signed)
[facility] [address] [place], [place], [address] Phone: [phone]   Fax:  [phone]  Physical Therapy Evaluation  Patient Details  Name: Melody Lewis MRN: [id number] Date of Birth: 20-Oct-1961 Referring Provider: Hulan Saas, MD  Encounter Date: 02/09/2016      PT End of Session - 02/09/16 1017    Visit Number 1   Date for PT Re-Evaluation 04/05/16   PT Start Time 0932   PT Stop Time [id number]   PT Time Calculation (min) 42 min   Activity Tolerance Patient tolerated treatment well   Behavior During Therapy [facility] for tasks assessed/performed      Past Medical History  Diagnosis Date  . SINUSITIS- ACUTE-NOS 12/22/2009  . ALLERGIC RHINITIS 10/18/2008  . SHOULDER PAIN, LEFT 10/18/2010  . WRIST PAIN, RIGHT 10/18/2008  . KNEE PAIN, RIGHT 10/18/2008  . LOW BACK PAIN 10/18/2008  . LUMBAR RADICULOPATHY, RIGHT 06/28/2009  . BACK PAIN, UPPER 10/18/2008  . BACK PAIN, UPPER 10/18/2008  . UNSPECIFIED NEURALGIA NEURITIS AND RADICULITIS 10/18/2008  . INTERMITTENT VERTIGO 11/01/2009  . Dysuria 10/18/2010  . GERD (gastroesophageal reflux disease) 06/20/2011  . Seasonal allergies   . Allergy   . Blood transfusion without reported diagnosis     Past Surgical History  Procedure Laterality Date  . Tonsillectomy  1987  . Abdominal hysterectomy  05/2005    due to fibroids, still born, ovaries intact  . Colonoscopy  08-04-2009  . Cesarean section  1983  . Dilation and curettage of uterus  2006    There were no vitals filed for this visit.  Visit Diagnosis:  Pain in joint of right shoulder - Plan: PT plan of care cert/re-cert  Shoulder stiffness, right - Plan: PT plan of care cert/re-cert  Posture abnormality - Plan: PT plan of care cert/re-cert      Subjective Assessment - 02/09/16 0935    Subjective Pt is a Rt hand dominant female who presents to PT with Rt shoulder pain that began 05/2015.  Pt now with limited AROM and  continued pain.     Pertinent History cortizone injection 06/2015 into Rt shoulder   Diagnostic tests x-ray: pt reports bursitis in Rt shoulder   Patient Stated Goals reduce Rt shoulder pain, improve Rt shoulder AROM and use, sleep with fewer interruptions   Currently in Pain? Yes   Pain Score 6   0/10 at rest, 6/10 with movement   Pain Location Shoulder   Pain Orientation Right   Pain Type Chronic pain   Pain Onset More than a month ago   Pain Frequency Intermittent   Aggravating Factors  reaching overhead wtih Rt UE, lifting   Pain Relieving Factors not using Rt UE            [facility] PT Assessment - 02/09/16 0001    Assessment   Medical Diagnosis Frozen shoulder syndrome, Rt    Referring Provider Hulan Saas, MD   Onset Date/Surgical Date 06/11/15   Hand Dominance Right   Next MD Visit 02/22/16   Precautions   Precautions None   Restrictions   Weight Bearing Restrictions No   Balance Screen   Has the patient fallen in the past 6 months No   Has the patient had a decrease in activity level because of a fear of falling?  No   Is the patient reluctant to leave their home because of a fear of falling?  No   Home Ecologist  residence   Type of Home House   Prior Function   Level of Independence Independent   Vocation Full time employment   Vocation Requirements desk work    Leisure none   Cognition   Overall Cognitive Status Within Functional Limits for tasks assessed   Observation/Other Assessments   Focus on Therapeutic Outcomes (FOTO)  46% limitation   Posture/Postural Control   Posture/Postural Control Postural limitations   Postural Limitations Forward head;Rounded Shoulders   ROM / Strength   AROM / PROM / Strength AROM;PROM;Strength   AROM   Overall AROM  Deficits   Overall AROM Comments Lt shoulder AROM is full without pain   AROM Assessment Site Shoulder   Right/Left Shoulder Right   Right Shoulder Flexion 116 Degrees    Right Shoulder ABduction 100 Degrees   Right Shoulder Internal Rotation --  lacking 8 inches vs Lt   Right Shoulder External Rotation --  lacking 3 inches vs Lt   PROM   Overall PROM  Deficits   PROM Assessment Site Shoulder   Right/Left Shoulder Right   Right Shoulder Flexion 108 Degrees   Right Shoulder ABduction 91 Degrees   Right Shoulder Internal Rotation 24 Degrees   Right Shoulder External Rotation 80 Degrees   Strength   Overall Strength Deficits   Overall Strength Comments Lt shoulder 4+/5 to 5/5 throughout   Strength Assessment Site Shoulder   Right/Left Shoulder Right   Right Shoulder Flexion 4/5   Right Shoulder ABduction 4-/5   Right Shoulder Internal Rotation 4+/5   Right Shoulder External Rotation 4/5   Palpation   Palpation comment palpable tenderness over Rt>Lt UT, tenderness over Rt proximal biceps tendon.  Reduced inferior glide of Rt glenohumeral joint.                             PT Education - 02/09/16 1009    Education provided Yes   Education Details HEP: shoulder AAROM and neck stretch, posture education   Person(s) Educated Patient   Methods Explanation;Demonstration   Comprehension Verbalized understanding;Returned demonstration          PT Short Term Goals - 02/09/16 1023    PT SHORT TERM GOAL #1   Title be independent in initial HEP   Time 4   Period Weeks   Status New   PT SHORT TERM GOAL #2   Title demonstrate Rt shoulder AROM IR to lacking < or = to 5 inches to improve use with self-care   Time 4   Period Weeks   Status New   PT SHORT TERM GOAL #3   Title report < or = to 4/10 Rt shoulder pain with use overhead   Time 4   Period Weeks   Status New   PT SHORT TERM GOAL #4   Title report postural corrections with sitting at work to improve scapular strength   Time 4   Period Weeks   Status New   PT SHORT TERM GOAL #5   Title demonstrate Rt shoulder AROM flexion to > or = to 130 degrees to improve overhead use    Time 4   Period Weeks   Status New           PT Long Term Goals - 02/09/16 [id number]    PT LONG TERM GOAL #1   Title be independent in advanced HEP   Time 8   Period Weeks   Status New  PT LONG TERM GOAL #2   Title reduce FOTO to < or = to 36% limitation   Time 8   Period Weeks   Status New   PT LONG TERM GOAL #3   Title report < or = to 2/10 Rt shoulder pain with use   Time 8   Period Weeks   Status New   PT LONG TERM GOAL #4   Title demonstrate Rt shoulder AROM IR to lacking < or = to 3 inches vs the Lt to improve use with self-care   Time 8   Period Weeks   Status New   PT LONG TERM GOAL #5   Title demonstrate Rt shoulder AROM flexion to > or = to 145 degrees to improve overhead use   Time 8   Period Weeks   Status New               Plan - 02/09/16 1017    Clinical Impression Statement Pt is a Rt hand dominant female who presents to PT with complaints of Rt shoulder pain and limited AROM.  Pain began 05/2015 without cause followed by reduced AROM of the Rt shoulder.  Pt demonstrates painful and limited AROM/PROM of the Rt shoulder and neck tension.  Pt with postural abnormality with difficulty sitting with upright posture.  Pt will benefit from skilled PT for Rt shoulder AROM, joint mobilizations, modalities, postural strength and ionto to Rt anterior shoulder joint.     Pt will benefit from skilled therapeutic intervention in order to improve on the following deficits Postural dysfunction;Decreased strength;Impaired flexibility;Pain;Decreased activity tolerance;Increased muscle spasms;Decreased range of motion   Rehab Potential Good   PT Frequency 2x / week   PT Duration 8 weeks   PT Treatment/Interventions ADLs/Self Care Home Management;Cryotherapy;Electrical Stimulation;Iontophoresis 4mg /ml Dexamethasone;Moist Heat;Therapeutic exercise;Therapeutic activities;Ultrasound;Neuromuscular re-education;Patient/family education;Manual techniques;Taping;Dry  needling;Passive range of motion   PT Next Visit Plan Postural strength with band, Rt shoulder AROM/PROM, joint mobs, ionto if order received, modalities   Consulted and Agree with Plan of Care Patient         Problem List Patient Active Problem List   Diagnosis Date Noted  . Frozen shoulder syndrome 01/03/2016  . Supraclavicular lymphadenopathy 12/26/2015  . Nasal sore 12/26/2015  . Right shoulder pain 03/17/2015  . Cystitis 04/20/2014  . Urinary frequency 03/03/2013  . Stress incontinence in female 03/03/2013  . Vaginal dryness, menopausal 03/03/2013  . GERD (gastroesophageal reflux disease) 06/20/2011  . Preventative health care 05/04/2011  . LUMBAR RADICULOPATHY, RIGHT 06/28/2009  . Allergic rhinitis 10/18/2008  . LOW BACK PAIN 10/18/2008  . BACK PAIN, UPPER 10/18/2008    TAKACS,KELLY, PT 02/09/2016, 10:28 AM  [facility] [address] [place], [place], [address] Phone: [phone]   Fax:  [phone]  Name: Melody Lewis MRN: [id number] Date of Birth: [birth year]

## 2016-02-09 NOTE — Patient Instructions (Signed)
Hold all stretches 5-10 seconds and perform 5-10 times, 3 times a day.  Copyright  VHI. All rights reserved.    Clasp hands together and raise arms above head, keeping elbows as straight as possible. Can be done sitting or lying.   Copyright  VHI. All rights reserved.  ROM: Towel Stretch - with Interior Rotation    Pull left arm up behind back by pulling towel up with other arm. Hold ____ seconds. Repeat ____ times per set. Do ____ sets per session. Do ____ sessions per day.  http://orth.exer.us/889   Copyright  VHI. All rights reserved.  AROM: Lateral Neck Flexion    Slowly tilt head toward one shoulder, then the other. Hold each position _20___ seconds. Repeat _3___ times per set. Do __1__ sets per session. Do __3__ sessions per day.  http://orth.exer.us/297    Posture - Standing   Good posture is important. Avoid slouching and forward head thrust. Maintain curve in low back and align ears over shoulders, hips over ankles.  Pull your belly button in toward your back bone. Posture Tips DO: - stand tall and erect - keep chin tucked in - keep head and shoulders in alignment - check posture regularly in mirror or large window - pull head back against headrest in car seat;  Change your position often.  Sit with lumbar support. DON'T: - slouch or slump while watching TV or reading - sit, stand or lie in one position  for too long;  Sitting is especially hard on the spine so if you sit at a desk/use the computer, then stand up often! Copyright  VHI. All rights reserved.  Posture - Sitting  Sit upright, head facing forward. Try using a roll to support lower back. Keep shoulders relaxed, and avoid rounded back. Keep hips level with knees. Avoid crossing legs for long periods. Copyright  VHI. All rights reserved.  Chronic neck strain can develop because of poor posture and faulty work habits  Postural strain related to slumped sitting and forward head posture is a leading cause  of headaches, neck and upper back pain  General strengthening and flexibility exercises are helpful in the treatment of neck pain.  Most importantly, you should learn to correct the posture that may be contributing to chronic pain.   Change positions frequently  Change your work or home environment to improve posture and mechanics.     Copyright  VHI. All rights reserved.  [facility] [address] [place], [place] [address] Phone # [phone] Fax [phone]

## 2016-02-14 ENCOUNTER — Ambulatory Visit (INDEPENDENT_AMBULATORY_CARE_PROVIDER_SITE_OTHER): Payer: BLUE CROSS/BLUE SHIELD | Admitting: Internal Medicine

## 2016-02-14 ENCOUNTER — Encounter: Payer: Self-pay | Admitting: Internal Medicine

## 2016-02-14 ENCOUNTER — Encounter: Payer: BLUE CROSS/BLUE SHIELD | Admitting: Physical Therapy

## 2016-02-14 VITALS — BP 102/70 | HR 80 | Temp 98.4°F | Resp 16 | Wt 232.0 lb

## 2016-02-14 DIAGNOSIS — H109 Unspecified conjunctivitis: Secondary | ICD-10-CM | POA: Diagnosis not present

## 2016-02-14 DIAGNOSIS — J0110 Acute frontal sinusitis, unspecified: Secondary | ICD-10-CM

## 2016-02-14 MED ORDER — VALACYCLOVIR HCL 1 G PO TABS: 2000.0000 mg | ORAL_TABLET | Freq: Two times a day (BID) | ORAL | Status: DC

## 2016-02-14 NOTE — Progress Notes (Signed)
Subjective:    Patient ID: Melody Lewis, female    DOB: July 03, 1961, 55 y.o.   MRN: [id number]  HPI She is here for an acute visit for possible eye infection and cold symptoms/sinus infection.   Her symptoms started last week.  She initially had nasal congestion and sneezing.  She has developed headache, sinus pressure, yellow nasal mucus, pnd, dry cough, intermittent ear pain, lightheadedness/dizziness and mild body aches.  Her left eye became red and irritated yesterday.  She denies discharged.  The eye is slightly red and looks slightly better today.    She has been taking xyzal daily.  She has nasal sores, which she has frequently and has not been using the nasal spray.  She uses the bactroban cream and she thinks it helps with the nasal sores.  She has been using vicks.     Medications and allergies reviewed with patient and updated if appropriate.  Patient Active Problem List   Diagnosis Date Noted  . Frozen shoulder syndrome 01/03/2016  . Supraclavicular lymphadenopathy 12/26/2015  . Nasal sore 12/26/2015  . Right shoulder pain 03/17/2015  . Cystitis 04/20/2014  . Urinary frequency 03/03/2013  . Stress incontinence in female 03/03/2013  . Vaginal dryness, menopausal 03/03/2013  . GERD (gastroesophageal reflux disease) 06/20/2011  . Preventative health care 05/04/2011  . LUMBAR RADICULOPATHY, RIGHT 06/28/2009  . Allergic rhinitis 10/18/2008  . LOW BACK PAIN 10/18/2008  . BACK PAIN, UPPER 10/18/2008    Current Outpatient Prescriptions on File Prior to Visit  Medication Sig Dispense Refill  . aspirin EC 81 MG tablet Take 1 tablet (81 mg total) by mouth daily. 90 tablet 11  . cyclobenzaprine (FLEXERIL) 5 MG tablet Take 1 tablet (5 mg total) by mouth 3 (three) times daily as needed for muscle spasms. 60 tablet 2  . Diclofenac Sodium (PENNSAID) 2 % SOLN Place 2 application onto the skin 2 (two) times daily. 112 g 3  . famciclovir (FAMVIR) 500 MG tablet Take 1 tablet (500 mg  total) by mouth 3 (three) times daily. 21 tablet 0  . gabapentin (NEURONTIN) 100 MG capsule One pill every eight hours as needed; dose may be increased by one pill each dose after 72 hours if only partially effective 30 capsule 2  . levocetirizine (XYZAL) 5 MG tablet Take 1 tablet (5 mg total) by mouth every evening. As needed 90 tablet 3  . mupirocin ointment (BACTROBAN) 2 % Place 1 application into the nose 2 (two) times daily. 22 g 0  . psyllium (METAMUCIL SMOOTH TEXTURE) 28 % packet Take 1 packet by mouth daily.    Marland Kitchen triamcinolone (NASACORT AQ) 55 MCG/ACT AERO nasal inhaler Place 2 sprays into the nose daily. 1 Inhaler 12  . [DISCONTINUED] mometasone (NASONEX) 50 MCG/ACT nasal spray 2 sprays by Nasal route daily. 17 g 2   No current facility-administered medications on file prior to visit.    Past Medical History  Diagnosis Date  . SINUSITIS- ACUTE-NOS 12/22/2009  . ALLERGIC RHINITIS 10/18/2008  . SHOULDER PAIN, LEFT 10/18/2010  . WRIST PAIN, RIGHT 10/18/2008  . KNEE PAIN, RIGHT 10/18/2008  . LOW BACK PAIN 10/18/2008  . LUMBAR RADICULOPATHY, RIGHT 06/28/2009  . BACK PAIN, UPPER 10/18/2008  . BACK PAIN, UPPER 10/18/2008  . UNSPECIFIED NEURALGIA NEURITIS AND RADICULITIS 10/18/2008  . INTERMITTENT VERTIGO 11/01/2009  . Dysuria 10/18/2010  . GERD (gastroesophageal reflux disease) 06/20/2011  . Seasonal allergies   . Allergy   . Blood transfusion without reported diagnosis  Past Surgical History  Procedure Laterality Date  . Tonsillectomy  1987  . Abdominal hysterectomy  05/2005    due to fibroids, still born, ovaries intact  . Colonoscopy  08-04-2009  . Cesarean section  1983  . Dilation and curettage of uterus  2006    Social History   Social History  . Marital Status: Married    Spouse Name: N/A  . Number of Children: N/A  . Years of Education: N/A   Social History Main Topics  . Smoking status: Never Smoker   . Smokeless tobacco: Never Used  . Alcohol Use: No  . Drug Use:  No  . Sexual Activity: Not on file   Other Topics Concern  . Not on file   Social History Narrative    Family History  Problem Relation Age of Onset  . Arthritis Mother   . Kidney disease Mother   . Hypertension Mother   . Arthritis Father   . Diabetes Father   . Parkinsonism Father   . Dementia Father   . Colon cancer Father   . Hypertension Brother   . Hypertension Daughter   . Colon cancer Maternal Aunt   . Rectal cancer Neg Hx   . Stomach cancer Neg Hx     Review of Systems  Constitutional: Negative for fever.  HENT: Positive for congestion, ear pain (at times, feels drainage at times), postnasal drip, sinus pressure and sneezing. Negative for sore throat.   Eyes: Positive for pain (sometimes), redness and itching (left eye). Negative for photophobia, discharge and visual disturbance.  Respiratory: Positive for cough (dry). Negative for shortness of breath and wheezing.   Cardiovascular: Negative for chest pain and palpitations.  Gastrointestinal: Positive for nausea (with dizziness). Negative for abdominal pain and diarrhea.  Musculoskeletal: Positive for myalgias (mild bodyaches).  Neurological: Positive for dizziness, light-headedness and headaches.       Objective:   Filed Vitals:   02/14/16 1059  BP: 102/70  Pulse: 80  Temp: 98.4 F (36.9 C)  Resp: 16   Filed Weights   02/14/16 1059  Weight: 232 lb (105.235 kg)   Body mass index is 39.8 kg/(m^2).   Physical Exam GENERAL APPEARANCE: Appears stated age, well appearing, NAD EYES: left conjunctiva slightly erythematous, no discharge from left eye, right conjunctiva clear, no icterus, EOMi, PERRLA HEENT: bilateral tympanic membranes and ear canals normal, oropharynx with mild erythema, mild sinus pressure, no thyromegaly, trachea midline, no cervical or supraclavicular lymphadenopathy LUNGS: Clear to auscultation without wheeze or crackles, unlabored breathing, good air entry bilaterally HEART: Normal  S1,S2 without murmurs EXTREMITIES: Without clubbing, cyanosis, or edema      Assessment & Plan:   Acute sinus infection, viral conjunctivitis Likely both viral in nature Warm compresses for her eyes - call if no improvement - I will prescribe an antibiotic eye drop Continue vicks, xyzal, start flonase Rest, fluids Note given for work   Will try valtrex for nasal sores in case it is hsv - she used to get frequent cold sores.   Call  If no improvement

## 2016-02-14 NOTE — Patient Instructions (Addendum)
Start warm compresses for your left eye.  If there is no improvement in the next two days please call so I can prescribe an antibiotic.   Try the anti-viral medication for the nasal sores.      Sinusitis, Adult Sinusitis is redness, soreness, and inflammation of the paranasal sinuses. Paranasal sinuses are air pockets within the bones of your face. They are located beneath your eyes, in the middle of your forehead, and above your eyes. In healthy paranasal sinuses, mucus is able to drain out, and air is able to circulate through them by way of your nose. However, when your paranasal sinuses are inflamed, mucus and air can become trapped. This can allow bacteria and other germs to grow and cause infection. Sinusitis can develop quickly and last only a short time (acute) or continue over a long period (chronic). Sinusitis that lasts for more than 12 weeks is considered chronic. CAUSES Causes of sinusitis include:  Allergies.  Structural abnormalities, such as displacement of the cartilage that separates your nostrils (deviated septum), which can decrease the air flow through your nose and sinuses and affect sinus drainage.  Functional abnormalities, such as when the small hairs (cilia) that line your sinuses and help remove mucus do not work properly or are not present. SIGNS AND SYMPTOMS Symptoms of acute and chronic sinusitis are the same. The primary symptoms are pain and pressure around the affected sinuses. Other symptoms include:  Upper toothache.  Earache.  Headache.  Bad breath.  Decreased sense of smell and taste.  A cough, which worsens when you are lying flat.  Fatigue.  Fever.  Thick drainage from your nose, which often is green and may contain pus (purulent).  Swelling and warmth over the affected sinuses. DIAGNOSIS Your health care provider will perform a physical exam. During your exam, your health care provider may perform any of the following to help determine if  you have acute sinusitis or chronic sinusitis:  Look in your nose for signs of abnormal growths in your nostrils (nasal polyps).  Tap over the affected sinus to check for signs of infection.  View the inside of your sinuses using an imaging device that has a light attached (endoscope). If your health care provider suspects that you have chronic sinusitis, one or more of the following tests may be recommended:  Allergy tests.  Nasal culture. A sample of mucus is taken from your nose, sent to a lab, and screened for bacteria.  Nasal cytology. A sample of mucus is taken from your nose and examined by your health care provider to determine if your sinusitis is related to an allergy. TREATMENT Most cases of acute sinusitis are related to a viral infection and will resolve on their own within 10 days. Sometimes, medicines are prescribed to help relieve symptoms of both acute and chronic sinusitis. These may include pain medicines, decongestants, nasal steroid sprays, or saline sprays. However, for sinusitis related to a bacterial infection, your health care provider will prescribe antibiotic medicines. These are medicines that will help kill the bacteria causing the infection. Rarely, sinusitis is caused by a fungal infection. In these cases, your health care provider will prescribe antifungal medicine. For some cases of chronic sinusitis, surgery is needed. Generally, these are cases in which sinusitis recurs more than 3 times per year, despite other treatments. HOME CARE INSTRUCTIONS  Drink plenty of water. Water helps thin the mucus so your sinuses can drain more easily.  Use a humidifier.  Inhale steam 3-4  times a day (for example, sit in the bathroom with the shower running).  Apply a warm, moist washcloth to your face 3-4 times a day, or as directed by your health care provider.  Use saline nasal sprays to help moisten and clean your sinuses.  Take medicines only as directed by your  health care provider.  If you were prescribed either an antibiotic or antifungal medicine, finish it all even if you start to feel better. SEEK IMMEDIATE MEDICAL CARE IF:  You have increasing pain or severe headaches.  You have nausea, vomiting, or drowsiness.  You have swelling around your face.  You have vision problems.  You have a stiff neck.  You have difficulty breathing.   This information is not intended to replace advice given to you by your health care provider. Make sure you discuss any questions you have with your health care provider.   Document Released: 11/26/2005 Document Revised: 12/17/2014 Document Reviewed: 12/11/2011 Elsevier Interactive Patient Education [employer].

## 2016-02-14 NOTE — Progress Notes (Signed)
Pre visit review using our clinic review tool, if applicable. No additional management support is needed unless otherwise documented below in the visit note. 

## 2016-02-16 ENCOUNTER — Encounter: Payer: BLUE CROSS/BLUE SHIELD | Admitting: Physical Therapy

## 2016-02-17 ENCOUNTER — Ambulatory Visit: Payer: BLUE CROSS/BLUE SHIELD | Admitting: Physical Therapy

## 2016-02-17 ENCOUNTER — Encounter: Payer: Self-pay | Admitting: Physical Therapy

## 2016-02-17 DIAGNOSIS — M25511 Pain in right shoulder: Secondary | ICD-10-CM

## 2016-02-17 DIAGNOSIS — R293 Abnormal posture: Secondary | ICD-10-CM

## 2016-02-17 DIAGNOSIS — M25611 Stiffness of right shoulder, not elsewhere classified: Secondary | ICD-10-CM

## 2016-02-17 NOTE — Therapy (Signed)
[facility] [address], [place], [place], [address] Phone: [phone]   Fax:  [phone]  Physical Therapy Treatment  Patient Details  Name: Melody Lewis MRN: [id number] Date of Birth: May 28, 1961 Referring Provider: Hulan Saas, MD  Encounter Date: 02/17/2016      PT End of Session - 02/17/16 0903    Visit Number 2   Date for PT Re-Evaluation 04/05/16   PT Start Time 0856   PT Stop Time 0930   PT Time Calculation (min) 34 min   Activity Tolerance Patient tolerated treatment well   Behavior During Therapy [facility] for tasks assessed/performed      Past Medical History  Diagnosis Date  . SINUSITIS- ACUTE-NOS 12/22/2009  . ALLERGIC RHINITIS 10/18/2008  . SHOULDER PAIN, LEFT 10/18/2010  . WRIST PAIN, RIGHT 10/18/2008  . KNEE PAIN, RIGHT 10/18/2008  . LOW BACK PAIN 10/18/2008  . LUMBAR RADICULOPATHY, RIGHT 06/28/2009  . BACK PAIN, UPPER 10/18/2008  . BACK PAIN, UPPER 10/18/2008  . UNSPECIFIED NEURALGIA NEURITIS AND RADICULITIS 10/18/2008  . INTERMITTENT VERTIGO 11/01/2009  . Dysuria 10/18/2010  . GERD (gastroesophageal reflux disease) 06/20/2011  . Seasonal allergies   . Allergy   . Blood transfusion without reported diagnosis     Past Surgical History  Procedure Laterality Date  . Tonsillectomy  1987  . Abdominal hysterectomy  05/2005    due to fibroids, still born, ovaries intact  . Colonoscopy  08-04-2009  . Cesarean section  1983  . Dilation and curettage of uterus  2006    There were no vitals filed for this visit.  Visit Diagnosis:  Pain in joint of right shoulder  Posture abnormality  Shoulder stiffness, right      Subjective Assessment - 02/17/16 0859    Subjective Pt reports has no pain in right shoulder with rest, but with lifting and reaching pain is up to 4/10.    Pertinent History cortizone injection 06/2015 into Rt shoulder   Diagnostic tests x-ray: pt reports bursitis in Rt shoulder   Patient Stated Goals reduce Rt shoulder pain, improve Rt shoulder AROM and use, sleep with fewer interruptions   Currently in Pain? Yes   Pain Score 4    Pain Location Shoulder   Pain Orientation Right   Pain Descriptors / Indicators Pins and needles;Pressure   Pain Type Chronic pain   Pain Onset More than a month ago   Pain Frequency Intermittent   Aggravating Factors  reaching overhead with Rt UE, lifting   Pain Relieving Factors not using Rt UE   Multiple Pain Sites No                         [facility] Adult PT Treatment/Exercise - 02/17/16 0001    Posture/Postural Control   Posture/Postural Control Postural limitations   Postural Limitations Forward head;Rounded Shoulders   Exercises   Exercises Shoulder   Shoulder Exercises: Supine   Protraction AROM  with hands clasped and in 90% shoulder flexion   External Rotation AROM  B UE using cane    Internal Rotation --   Flexion AAROM  overhead flexion x 10   Shoulder Exercises: Pulleys   Flexion 3 minutes   ABduction 3 minutes   Modalities   Modalities Iontophoresis   Iontophoresis   Type of Iontophoresis Dexamethasone   Location Rt ant shoulder   Dose #1    21ml   Time 6hour patch  PT Education - 02/17/16 0927    Education provided Yes   Education Details ER bil in supine (picture shows in sitting), Ionto precaution   Person(s) Educated Patient   Methods Explanation;Demonstration   Comprehension Verbalized understanding          PT Short Term Goals - 02/09/16 1023    PT SHORT TERM GOAL #1   Title be independent in initial HEP   Time 4   Period Weeks   Status New   PT SHORT TERM GOAL #2   Title demonstrate Rt shoulder AROM IR to lacking < or = to 5 inches to improve use with self-care   Time 4   Period Weeks   Status New   PT SHORT TERM GOAL #3   Title report < or = to 4/10 Rt shoulder pain with use overhead   Time 4   Period Weeks   Status New   PT SHORT TERM GOAL  #4   Title report postural corrections with sitting at work to improve scapular strength   Time 4   Period Weeks   Status New   PT SHORT TERM GOAL #5   Title demonstrate Rt shoulder AROM flexion to > or = to 130 degrees to improve overhead use   Time 4   Period Weeks   Status New           PT Long Term Goals - 02/09/16 [id number]    PT LONG TERM GOAL #1   Title be independent in advanced HEP   Time 8   Period Weeks   Status New   PT LONG TERM GOAL #2   Title reduce FOTO to < or = to 36% limitation   Time 8   Period Weeks   Status New   PT LONG TERM GOAL #3   Title report < or = to 2/10 Rt shoulder pain with use   Time 8   Period Weeks   Status New   PT LONG TERM GOAL #4   Title demonstrate Rt shoulder AROM IR to lacking < or = to 3 inches vs the Lt to improve use with self-care   Time 8   Period Weeks   Status New   PT LONG TERM GOAL #5   Title demonstrate Rt shoulder AROM flexion to > or = to 145 degrees to improve overhead use   Time 8   Period Weeks   Status New               Plan - 02/17/16 [id number]    Clinical Impression Statement Pt was able to tolerate activities well and presents with improvement of ROM Rt shoulder after pulley's. Pt will benefit from skilled PT for Rt shoulder AROM, joint mobilization, postural strength and ionto to Rt ant shoulder joint   Pt will benefit from skilled therapeutic intervention in order to improve on the following deficits Postural dysfunction;Decreased strength;Impaired flexibility;Pain;Decreased activity tolerance;Increased muscle spasms;Decreased range of motion   Rehab Potential Good   PT Frequency 2x / week   PT Duration 8 weeks   PT Treatment/Interventions ADLs/Self Care Home Management;Cryotherapy;Electrical Stimulation;Iontophoresis 4mg /ml Dexamethasone;Moist Heat;Therapeutic exercise;Therapeutic activities;Ultrasound;Neuromuscular re-education;Patient/family education;Manual techniques;Taping;Dry needling;Passive range  of motion   PT Next Visit Plan Postural strength with band, Rt shoulder AROM/PROM, joint mobs, ionto if order received, modalities   Consulted and Agree with Plan of Care Patient        Problem List Patient Active Problem List   Diagnosis Date Noted  . Frozen  shoulder syndrome 01/03/2016  . Supraclavicular lymphadenopathy 12/26/2015  . Nasal sore 12/26/2015  . Right shoulder pain 03/17/2015  . Cystitis 04/20/2014  . Urinary frequency 03/03/2013  . Stress incontinence in female 03/03/2013  . Vaginal dryness, menopausal 03/03/2013  . GERD (gastroesophageal reflux disease) 06/20/2011  . Preventative health care 05/04/2011  . LUMBAR RADICULOPATHY, RIGHT 06/28/2009  . Allergic rhinitis 10/18/2008  . LOW BACK PAIN 10/18/2008  . BACK PAIN, UPPER 10/18/2008    Lewis,Melody Pikus PTA 02/17/2016, 9:52 AM  [facility] [address], [place], [place], [address] Phone: [phone]   Fax:  [phone]  Name: CERENITI DETEMPLE MRN: [id number] Date of Birth: 1961/11/27

## 2016-02-17 NOTE — Patient Instructions (Addendum)
(  Home) External Rotation: in Abduction - Sitting    Lie on your back with a yard stick in your hands Bring both of your arms over your head Repeat x 10 Perform up to 3 sets   = 30 2-3 x a day Copyright  VHI. All rights reserved.  IONTOPHORESIS PATIENT PRECAUTIONS & CONTRAINDICATIONS:  . Redness under one or both electrodes can occur.  This characterized by a uniform redness that usually disappears within 12 hours of treatment. . Small pinhead size blisters may result in response to the drug.  Contact your physician if the problem persists more than 24 hours. . On rare occasions, iontophoresis therapy can result in temporary skin reactions such as rash, inflammation, irritation or burns.  The skin reactions may be the result of individual sensitivity to the ionic solution used, the condition of the skin at the start of treatment, reaction to the materials in the electrodes, allergies or sensitivity to dexamethasone, or a poor connection between the patch and your skin.  Discontinue using iontophoresis if you have any of these reactions and report to your therapist. . Remove the Patch or electrodes if you have any undue sensation of pain or burning during the treatment and report discomfort to your therapist. . Tell your Therapist if you have had known adverse reactions to the application of electrical current. . If using the Patch, the LED light will turn off when treatment is complete and the patch can be removed.  Approximate treatment time is 1-3 hours.  Remove the patch when light goes off or after 6 hours. . The Patch can be worn during normal activity, however excessive motion where the electrodes have been placed can cause poor contact between the skin and the electrode or uneven electrical current resulting in greater risk of skin irritation. Marland Kitchen Keep out of the reach of children.   . DO NOT use if you have a cardiac pacemaker or any other electrically sensitive implanted device. . DO  NOT use if you have a known sensitivity to dexamethasone. . DO NOT use during Magnetic Resonance Imaging (MRI). . DO NOT use over broken or compromised skin (e.g. sunburn, cuts, or acne) due to the increased risk of skin reaction. . DO NOT SHAVE over the area to be treated:  To establish good contact between the Patch and the skin, excessive hair may be clipped. . DO NOT place the Patch or electrodes on or over your eyes, directly over your heart, or brain. . DO NOT reuse the Patch or electrodes as this may cause burns to occur.

## 2016-02-21 ENCOUNTER — Ambulatory Visit: Payer: BLUE CROSS/BLUE SHIELD | Admitting: Physical Therapy

## 2016-02-21 ENCOUNTER — Encounter: Payer: Self-pay | Admitting: Physical Therapy

## 2016-02-21 DIAGNOSIS — M25611 Stiffness of right shoulder, not elsewhere classified: Secondary | ICD-10-CM

## 2016-02-21 DIAGNOSIS — R293 Abnormal posture: Secondary | ICD-10-CM

## 2016-02-21 DIAGNOSIS — M25511 Pain in right shoulder: Secondary | ICD-10-CM

## 2016-02-21 NOTE — Therapy (Signed)
[facility] [address] [place], [place], [address] Phone: [phone]   Fax:  [phone]  Physical Therapy Treatment  Patient Details  Name: Melody Lewis MRN: [id number] Date of Birth: Dec 16, 1960 Referring Provider: Hulan Saas, MD  Encounter Date: 02/21/2016      PT End of Session - 02/21/16 1253    Visit Number 3   Date for PT Re-Evaluation 04/05/16   PT Start Time 1238   PT Stop Time 1316   PT Time Calculation (min) 38 min   Activity Tolerance Patient tolerated treatment well   Behavior During Therapy [facility] for tasks assessed/performed      Past Medical History  Diagnosis Date  . SINUSITIS- ACUTE-NOS 12/22/2009  . ALLERGIC RHINITIS 10/18/2008  . SHOULDER PAIN, LEFT 10/18/2010  . WRIST PAIN, RIGHT 10/18/2008  . KNEE PAIN, RIGHT 10/18/2008  . LOW BACK PAIN 10/18/2008  . LUMBAR RADICULOPATHY, RIGHT 06/28/2009  . BACK PAIN, UPPER 10/18/2008  . BACK PAIN, UPPER 10/18/2008  . UNSPECIFIED NEURALGIA NEURITIS AND RADICULITIS 10/18/2008  . INTERMITTENT VERTIGO 11/01/2009  . Dysuria 10/18/2010  . GERD (gastroesophageal reflux disease) 06/20/2011  . Seasonal allergies   . Allergy   . Blood transfusion without reported diagnosis     Past Surgical History  Procedure Laterality Date  . Tonsillectomy  1987  . Abdominal hysterectomy  05/2005    due to fibroids, still born, ovaries intact  . Colonoscopy  08-04-2009  . Cesarean section  1983  . Dilation and curettage of uterus  2006    There were no vitals filed for this visit.  Visit Diagnosis:  Pain in joint of right shoulder  Posture abnormality  Shoulder stiffness, right      Subjective Assessment - 02/21/16 1251    Subjective No complain of pain with rest but with movement at available end range up to 3-4/10.   Pertinent History cortizone injection 06/2015 into Rt shoulder   Diagnostic tests x-ray: pt reports bursitis in Rt shoulder   Patient Stated Goals  reduce Rt shoulder pain, improve Rt shoulder AROM and use, sleep with fewer interruptions   Pain Score 4    Pain Location Shoulder   Pain Orientation Right   Pain Descriptors / Indicators Pins and needles;Pressure   Pain Type Chronic pain   Pain Onset More than a month ago   Pain Frequency Intermittent   Aggravating Factors  reaching overhead with Rt UE, lifting   Multiple Pain Sites No                         [facility] Adult PT Treatment/Exercise - 02/21/16 0001    Posture/Postural Control   Posture/Postural Control Postural limitations   Postural Limitations Forward head;Rounded Shoulders   Exercises   Exercises Shoulder   Shoulder Exercises: Supine   Other Supine Exercises Foam roll x 3 min, B UE into overhead flexion, snowangel x 10   Shoulder Exercises: Pulleys   Flexion 3 minutes   ABduction 3 minutes   Modalities   Modalities Iontophoresis   Iontophoresis   Type of Iontophoresis Dexamethasone   Location Rt ant shoulder   Dose #2     17ml   Time 6hour patch   Manual Therapy   Manual Therapy Soft tissue mobilization;Joint mobilization   Manual therapy comments Gentle crossfriction to rt ant shoulder   Joint Mobilization PROM Rt shoulder , caudal & posterior glide  PT Education - 02/21/16 1300    Education provided Yes   Education Details Pectoralis sstretch   Person(s) Educated Patient   Methods Explanation;Demonstration;Handout   Comprehension Verbalized understanding;Returned demonstration          PT Short Term Goals - 02/21/16 1257    PT SHORT TERM GOAL #1   Title be independent in initial HEP   Time 4   Period Weeks   Status On-going   PT SHORT TERM GOAL #2   Title demonstrate Rt shoulder AROM IR to lacking < or = to 5 inches to improve use with self-care   Time 4   Period Weeks   Status On-going   PT SHORT TERM GOAL #3   Title report < or = to 4/10 Rt shoulder pain with use overhead   Time 4   Period Weeks    Status On-going   PT SHORT TERM GOAL #4   Title report postural corrections with sitting at work to improve scapular strength   Time 4   Period Weeks   Status On-going   PT SHORT TERM GOAL #5   Title demonstrate Rt shoulder AROM flexion to > or = to 130 degrees to improve overhead use   Time 4   Period Weeks   Status On-going           PT Long Term Goals - 02/09/16 [id number]    PT LONG TERM GOAL #1   Title be independent in advanced HEP   Time 8   Period Weeks   Status New   PT LONG TERM GOAL #2   Title reduce FOTO to < or = to 36% limitation   Time 8   Period Weeks   Status New   PT LONG TERM GOAL #3   Title report < or = to 2/10 Rt shoulder pain with use   Time 8   Period Weeks   Status New   PT LONG TERM GOAL #4   Title demonstrate Rt shoulder AROM IR to lacking < or = to 3 inches vs the Lt to improve use with self-care   Time 8   Period Weeks   Status New   PT LONG TERM GOAL #5   Title demonstrate Rt shoulder AROM flexion to > or = to 145 degrees to improve overhead use   Time 8   Period Weeks   Status New               Plan - 02/21/16 1253    Clinical Impression Statement Pt with improved range of movement after activities. Pt with palpaple tenderness and pain in right ant shoulder joint. Pt will conitnue to benefit from skilled PT for Rt shoulder AROM, joint mobilization, postural strength and ionto to Rt ant shoulder joint    Pt will benefit from skilled therapeutic intervention in order to improve on the following deficits Postural dysfunction;Decreased strength;Impaired flexibility;Pain;Decreased activity tolerance;Increased muscle spasms;Decreased range of motion   Rehab Potential Good   PT Frequency 2x / week   PT Duration 8 weeks   PT Treatment/Interventions ADLs/Self Care Home Management;Cryotherapy;Electrical Stimulation;Iontophoresis 4mg /ml Dexamethasone;Moist Heat;Therapeutic exercise;Therapeutic activities;Ultrasound;Neuromuscular  re-education;Patient/family education;Manual techniques;Taping;Dry needling;Passive range of motion   PT Next Visit Plan May try [place],  Foam roll, mobs, ionto #2   Consulted and Agree with Plan of Care Patient        Problem List Patient Active Problem List   Diagnosis Date Noted  . Frozen shoulder syndrome 01/03/2016  . Supraclavicular lymphadenopathy  12/26/2015  . Nasal sore 12/26/2015  . Right shoulder pain 03/17/2015  . Cystitis 04/20/2014  . Urinary frequency 03/03/2013  . Stress incontinence in female 03/03/2013  . Vaginal dryness, menopausal 03/03/2013  . GERD (gastroesophageal reflux disease) 06/20/2011  . Preventative health care 05/04/2011  . LUMBAR RADICULOPATHY, RIGHT 06/28/2009  . Allergic rhinitis 10/18/2008  . LOW BACK PAIN 10/18/2008  . BACK PAIN, UPPER 10/18/2008    NAUMANN-HOUEGNIFIO,Talajah Slimp PTA 02/21/2016, 1:31 PM  [facility] [address], [place], [place], [address] Phone: [phone]   Fax:  [phone]  Name: Melody Lewis MRN: [id number] Date of Birth: June 06, 1961

## 2016-02-21 NOTE — Patient Instructions (Signed)
CHEST: Doorway, Bilateral - Standing    Standing in doorway, place hands on wall with elbows bent at shoulder height. Lean forward. Hold ___ seconds. ___ reps per set, ___ sets per day, ___ days per week  Copyright  VHI. All rights reserved.

## 2016-02-22 ENCOUNTER — Ambulatory Visit (INDEPENDENT_AMBULATORY_CARE_PROVIDER_SITE_OTHER): Payer: BLUE CROSS/BLUE SHIELD | Admitting: Internal Medicine

## 2016-02-22 DIAGNOSIS — J3489 Other specified disorders of nose and nasal sinuses: Secondary | ICD-10-CM

## 2016-02-22 NOTE — Patient Instructions (Signed)
None  - error 

## 2016-02-22 NOTE — Assessment & Plan Note (Signed)
Opened in error, pt not seen 

## 2016-02-22 NOTE — Progress Notes (Signed)
Opened in error

## 2016-02-23 ENCOUNTER — Ambulatory Visit: Payer: BLUE CROSS/BLUE SHIELD

## 2016-02-23 DIAGNOSIS — M25511 Pain in right shoulder: Secondary | ICD-10-CM | POA: Diagnosis not present

## 2016-02-23 DIAGNOSIS — M25611 Stiffness of right shoulder, not elsewhere classified: Secondary | ICD-10-CM

## 2016-02-23 DIAGNOSIS — R293 Abnormal posture: Secondary | ICD-10-CM

## 2016-02-23 NOTE — Therapy (Signed)
[facility] [address], [place], [place], [address] Phone: [phone]   Fax:  [phone]  Physical Therapy Treatment  Patient Details  Name: Melody Lewis MRN: [id number] Date of Birth: 02/13/1961 Referring Provider: Hulan Saas, MD  Encounter Date: 02/23/2016      PT End of Session - 02/23/16 1005    Visit Number 4   Date for PT Re-Evaluation 04/05/16   PT Start Time 0940  Pt 10 minutes late   PT Stop Time 1016   PT Time Calculation (min) 36 min   Activity Tolerance Patient tolerated treatment well   Behavior During Therapy [facility] for tasks assessed/performed      Past Medical History  Diagnosis Date  . SINUSITIS- ACUTE-NOS 12/22/2009  . ALLERGIC RHINITIS 10/18/2008  . SHOULDER PAIN, LEFT 10/18/2010  . WRIST PAIN, RIGHT 10/18/2008  . KNEE PAIN, RIGHT 10/18/2008  . LOW BACK PAIN 10/18/2008  . LUMBAR RADICULOPATHY, RIGHT 06/28/2009  . BACK PAIN, UPPER 10/18/2008  . BACK PAIN, UPPER 10/18/2008  . UNSPECIFIED NEURALGIA NEURITIS AND RADICULITIS 10/18/2008  . INTERMITTENT VERTIGO 11/01/2009  . Dysuria 10/18/2010  . GERD (gastroesophageal reflux disease) 06/20/2011  . Seasonal allergies   . Allergy   . Blood transfusion without reported diagnosis     Past Surgical History  Procedure Laterality Date  . Tonsillectomy  1987  . Abdominal hysterectomy  05/2005    due to fibroids, still born, ovaries intact  . Colonoscopy  08-04-2009  . Cesarean section  1983  . Dilation and curettage of uterus  2006    There were no vitals filed for this visit.  Visit Diagnosis:  Pain in joint of right shoulder  Posture abnormality  Shoulder stiffness, right      Subjective Assessment - 02/23/16 0942    Subjective 10 minutes late. Pt reports that Rt shoulder is feeling much better overall.   Diagnostic tests x-ray: pt reports bursitis in Rt shoulder   Currently in Pain? Yes   Pain Score 2    Pain Location Shoulder   Pain Orientation Right   Pain Descriptors / Indicators Pins and needles;Pressure   Pain Type Chronic pain   Pain Onset More than a month ago   Pain Frequency Intermittent   Aggravating Factors  reaching overhead, lifting   Pain Relieving Factors not performing aggravating motion            [facility] PT Assessment - 02/23/16 0001    AROM   Right Shoulder Flexion 119 Degrees   Right Shoulder Internal Rotation --  3.5 inches lacking vs the Lt                     [facility] Adult PT Treatment/Exercise - 02/23/16 0001    Posture/Postural Control   Posture/Postural Control Postural limitations   Postural Limitations Forward head;Rounded Shoulders   Exercises   Exercises Shoulder   Shoulder Exercises: Supine   Other Supine Exercises Foam roll x 3 min, B UE into overhead flexion, snowangel x 10   Shoulder Exercises: Standing   Flexion AAROM;10 reps;Right   ABduction AAROM;Right;10 reps   Extension Strengthening;Both;20 reps;Theraband   Theraband Level (Shoulder Extension) Level 2 (Red)   Row Strengthening;Both;20 reps;Theraband   Theraband Level (Shoulder Row) Level 2 (Red)   Shoulder Exercises: Pulleys   Flexion 3 minutes   ABduction 3 minutes   Shoulder Exercises: ROM/Strengthening   UBE (Upper Arm Bike) Arm bike: level 1x 6 minutes (3/3)  Shoulder Exercises: Stretch   Corner Stretch 3 reps;20 seconds   Modalities   Modalities Iontophoresis   Iontophoresis   Type of Iontophoresis Dexamethasone   Location Rt ant shoulder   Dose #3     77ml   Time 6hour patch                PT Education - 02/23/16 1003    Education provided Yes   Education Details red theraband: bil shoulder extension and rowing,    Person(s) Educated Patient   Methods Explanation;Demonstration;Handout   Comprehension Verbalized understanding;Returned demonstration          PT Short Term Goals - 02/23/16 0946    PT SHORT TERM GOAL #1   Title be independent in initial HEP   Status  Achieved   PT SHORT TERM GOAL #2   Title demonstrate Rt shoulder AROM IR to lacking < or = to 5 inches to improve use with self-care   Status Achieved   PT SHORT TERM GOAL #3   Title report < or = to 4/10 Rt shoulder pain with use overhead   Status Achieved   PT SHORT TERM GOAL #4   Title report postural corrections with sitting at work to improve scapular strength   Status Achieved   PT SHORT TERM GOAL #5   Title demonstrate Rt shoulder AROM flexion to > or = to 130 degrees to improve overhead use   Time 4   Period Weeks   Status On-going  119 degrees           PT Long Term Goals - 02/09/16 0929    PT LONG TERM GOAL #1   Title be independent in advanced HEP   Time 8   Period Weeks   Status New   PT LONG TERM GOAL #2   Title reduce FOTO to < or = to 36% limitation   Time 8   Period Weeks   Status New   PT LONG TERM GOAL #3   Title report < or = to 2/10 Rt shoulder pain with use   Time 8   Period Weeks   Status New   PT LONG TERM GOAL #4   Title demonstrate Rt shoulder AROM IR to lacking < or = to 3 inches vs the Lt to improve use with self-care   Time 8   Period Weeks   Status New   PT LONG TERM GOAL #5   Title demonstrate Rt shoulder AROM flexion to > or = to 145 degrees to improve overhead use   Time 8   Period Weeks   Status New               Plan - 02/23/16 [id number]    Clinical Impression Statement Pt reports improved AROM of the Rt UE and ease of movement since the start of care.  Pt demonstrates Rt shoulder AROM to 119 degrees and IR lacking 3.5 inches (8 inches at evaluation).  Pt with continued limited AROM and will benefit from PT for AROM progression and scapular strength to improve scapular position.     Pt will benefit from skilled therapeutic intervention in order to improve on the following deficits Postural dysfunction;Decreased strength;Impaired flexibility;Pain;Decreased activity tolerance;Increased muscle spasms;Decreased range of motion    Rehab Potential Good   PT Frequency 2x / week   PT Duration 8 weeks   PT Treatment/Interventions ADLs/Self Care Home Management;Cryotherapy;Electrical Stimulation;Iontophoresis 4mg /ml Dexamethasone;Moist Heat;Therapeutic exercise;Therapeutic activities;Ultrasound;Neuromuscular re-education;Patient/family education;Manual techniques;Taping;Dry needling;Passive range of motion  PT Next Visit Plan Foam roll, continue ionto, review scapular strength issued today.     Consulted and Agree with Plan of Care Patient        Problem List Patient Active Problem List   Diagnosis Date Noted  . Frozen shoulder syndrome 01/03/2016  . Supraclavicular lymphadenopathy 12/26/2015  . Nasal sore 12/26/2015  . Right shoulder pain 03/17/2015  . Cystitis 04/20/2014  . Urinary frequency 03/03/2013  . Stress incontinence in female 03/03/2013  . Vaginal dryness, menopausal 03/03/2013  . GERD (gastroesophageal reflux disease) 06/20/2011  . Preventative health care 05/04/2011  . LUMBAR RADICULOPATHY, RIGHT 06/28/2009  . Allergic rhinitis 10/18/2008  . LOW BACK PAIN 10/18/2008  . BACK PAIN, UPPER 10/18/2008   Sigurd Sos, PT 02/23/2016 10:18 AM  [facility] [address] [place], [place], [address] Phone: [phone]   Fax:  [phone]  Name: Melody Lewis MRN: [id number] Date of Birth: 01-Aug-1961

## 2016-02-23 NOTE — Patient Instructions (Addendum)
KEEP HEAD IN NEUTRAL AND SHOULDERS DOWN AND RELAXED   Hold tubing in both hands, arms forward. Pull arm back, elbow straight. Repeat __10__ times per set. Do __2__ sets per session. Do _1-2___ sessions per day.  Copyright  VHI. All rights reserved.     With resistive band anchored in door, grasp both ends. Keeping elbows bent, pull back, squeezing shoulder blades together. Hold _3__ seconds. Repeat _2x10___ times. Do _1-2___ sessions per day.  http://gt2.exer.us/98  IONTOPHORESIS PATIENT PRECAUTIONS & CONTRAINDICATIONS:  . Redness under one or both electrodes can occur.  This characterized by a uniform redness that usually disappears within 12 hours of treatment. . Small pinhead size blisters may result in response to the drug.  Contact your physician if the problem persists more than 24 hours. . On rare occasions, iontophoresis therapy can result in temporary skin reactions such as rash, inflammation, irritation or burns.  The skin reactions may be the result of individual sensitivity to the ionic solution used, the condition of the skin at the start of treatment, reaction to the materials in the electrodes, allergies or sensitivity to dexamethasone, or a poor connection between the patch and your skin.  Discontinue using iontophoresis if you have any of these reactions and report to your therapist. . Remove the Patch or electrodes if you have any undue sensation of pain or burning during the treatment and report discomfort to your therapist. . Tell your Therapist if you have had known adverse reactions to the application of electrical current. . If using the Patch, the LED light will turn off when treatment is complete and the patch can be removed.  Approximate treatment time is 1-3 hours.  Remove the patch when light goes off or after 6 hours. . The Patch can be worn during normal activity, however excessive motion where the electrodes have been placed can cause poor contact between the  skin and the electrode or uneven electrical current resulting in greater risk of skin irritation. Marland Kitchen Keep out of the reach of children.   . DO NOT use if you have a cardiac pacemaker or any other electrically sensitive implanted device. . DO NOT use if you have a known sensitivity to dexamethasone. . DO NOT use during Magnetic Resonance Imaging (MRI). . DO NOT use over broken or compromised skin (e.g. sunburn, cuts, or acne) due to the increased risk of skin reaction. . DO NOT SHAVE over the area to be treated:  To establish good contact between the Patch and the skin, excessive hair may be clipped. . DO NOT place the Patch or electrodes on or over your eyes, directly over your heart, or brain. DO NOT reuse the Patch or electrodes as this may cause burns to occur.  [facility] [address], [place], [place] [address] Phone # [phone] Fax [phone]

## 2016-02-24 ENCOUNTER — Encounter: Payer: Self-pay | Admitting: Internal Medicine

## 2016-02-24 ENCOUNTER — Ambulatory Visit (INDEPENDENT_AMBULATORY_CARE_PROVIDER_SITE_OTHER): Payer: BLUE CROSS/BLUE SHIELD | Admitting: Internal Medicine

## 2016-02-24 VITALS — BP 138/88 | HR 85 | Temp 98.4°F | Resp 20 | Wt 233.0 lb

## 2016-02-24 DIAGNOSIS — J019 Acute sinusitis, unspecified: Secondary | ICD-10-CM | POA: Insufficient documentation

## 2016-02-24 DIAGNOSIS — M5412 Radiculopathy, cervical region: Secondary | ICD-10-CM | POA: Insufficient documentation

## 2016-02-24 DIAGNOSIS — J309 Allergic rhinitis, unspecified: Secondary | ICD-10-CM

## 2016-02-24 MED ORDER — HYDROCODONE-HOMATROPINE 5-1.5 MG/5ML PO SYRP: 5.0000 mL | ORAL_SOLUTION | Freq: Four times a day (QID) | ORAL | Status: DC | PRN

## 2016-02-24 MED ORDER — PREDNISONE 10 MG PO TABS: ORAL_TABLET | ORAL | Status: DC

## 2016-02-24 MED ORDER — AZITHROMYCIN 250 MG PO TABS: ORAL_TABLET | ORAL | Status: DC

## 2016-02-24 NOTE — Progress Notes (Signed)
Pre visit review using our clinic review tool, if applicable. No additional management support is needed unless otherwise documented below in the visit note. 

## 2016-02-24 NOTE — Patient Instructions (Signed)
Please take all new medication as prescribed - the antibiotic, cough medicine, and prednisone ° °Please continue all other medications as before, and refills have been done if requested. ° °Please have the pharmacy call with any other refills you may need. ° °Please keep your appointments with your specialists as you may have planned ° ° ° °

## 2016-02-24 NOTE — Progress Notes (Signed)
Subjective:    Patient ID: Melody Lewis, female    DOB: August 01, 1961, 55 y.o.   MRN: [id number]  HPI   Here with 2-3 days acute onset fever, facial pain, pressure, headache, general weakness and malaise, and greenish d/c, with mild ST and cough, but pt denies chest pain, wheezing, increased sob or doe, orthopnea, PND, increased LE swelling, palpitations, dizziness or syncope.  Does have several wks ongoing nasal allergy symptoms with clearish congestion, itch and sneezing, without fever, pain, ST, cough, swelling or wheezing.  Pt also c/o left neck pain post lat with radicular like pain and "funny feeling" to below the left elbow, but no bowel or bladder change, fever, wt loss,  Worsening other UE or LE pain/numbness/weakness, gait change or falls. Past Medical History  Diagnosis Date  . SINUSITIS- ACUTE-NOS 12/22/2009  . ALLERGIC RHINITIS 10/18/2008  . SHOULDER PAIN, LEFT 10/18/2010  . WRIST PAIN, RIGHT 10/18/2008  . KNEE PAIN, RIGHT 10/18/2008  . LOW BACK PAIN 10/18/2008  . LUMBAR RADICULOPATHY, RIGHT 06/28/2009  . BACK PAIN, UPPER 10/18/2008  . BACK PAIN, UPPER 10/18/2008  . UNSPECIFIED NEURALGIA NEURITIS AND RADICULITIS 10/18/2008  . INTERMITTENT VERTIGO 11/01/2009  . Dysuria 10/18/2010  . GERD (gastroesophageal reflux disease) 06/20/2011  . Seasonal allergies   . Allergy   . Blood transfusion without reported diagnosis    Past Surgical History  Procedure Laterality Date  . Tonsillectomy  1987  . Abdominal hysterectomy  05/2005    due to fibroids, still born, ovaries intact  . Colonoscopy  08-04-2009  . Cesarean section  1983  . Dilation and curettage of uterus  2006    reports that she has never smoked. She has never used smokeless tobacco. She reports that she does not drink alcohol or use illicit drugs. family history includes Arthritis in her father and mother; Colon cancer in her father and maternal aunt; Dementia in her father; Diabetes in her father; Hypertension in her brother,  daughter, and mother; Kidney disease in her mother; Parkinsonism in her father. There is no history of Rectal cancer or Stomach cancer. Allergies  Allergen Reactions  . Sulfamethoxazole-Trimethoprim     REACTION: Faint   Current Outpatient Prescriptions on File Prior to Visit  Medication Sig Dispense Refill  . aspirin EC 81 MG tablet Take 1 tablet (81 mg total) by mouth daily. 90 tablet 11  . cyclobenzaprine (FLEXERIL) 5 MG tablet Take 1 tablet (5 mg total) by mouth 3 (three) times daily as needed for muscle spasms. 60 tablet 2  . Diclofenac Sodium (PENNSAID) 2 % SOLN Place 2 application onto the skin 2 (two) times daily. 112 g 3  . gabapentin (NEURONTIN) 100 MG capsule One pill every eight hours as needed; dose may be increased by one pill each dose after 72 hours if only partially effective 30 capsule 2  . levocetirizine (XYZAL) 5 MG tablet Take 1 tablet (5 mg total) by mouth every evening. As needed 90 tablet 3  . mupirocin ointment (BACTROBAN) 2 % Place 1 application into the nose 2 (two) times daily. 22 g 0  . psyllium (METAMUCIL SMOOTH TEXTURE) 28 % packet Take 1 packet by mouth daily.    Marland Kitchen triamcinolone (NASACORT AQ) 55 MCG/ACT AERO nasal inhaler Place 2 sprays into the nose daily. 1 Inhaler 12  . valACYclovir (VALTREX) 1000 MG tablet Take 2 tablets (2,000 mg total) by mouth 2 (two) times daily. For one day for outbreak of sores 12 tablet 5  . [DISCONTINUED] mometasone (  NASONEX) 50 MCG/ACT nasal spray 2 sprays by Nasal route daily. 17 g 2   No current facility-administered medications on file prior to visit.   Review of Systems  Constitutional: Negative for unusual diaphoresis or night sweats HENT: Negative for ringing in ear or discharge Eyes: Negative for double vision or worsening visual disturbance.  Respiratory: Negative for choking and stridor.   Gastrointestinal: Negative for vomiting or other signifcant bowel change Genitourinary: Negative for hematuria or change in urine  volume.  Musculoskeletal: Negative for other MSK pain or swelling Skin: Negative for color change and worsening wound.  Neurological: Negative for tremors and numbness other than noted  Psychiatric/Behavioral: Negative for decreased concentration or agitation other than above       Objective:   Physical Exam BP 138/88 mmHg  Pulse 85  Temp(Src) 98.4 F (36.9 C) (Oral)  Resp 20  Wt 233 lb (105.688 kg)  SpO2 95% VS noted, mild ill Constitutional: Pt appears in no significant distress HENT: Head: NCAT.  Right Ear: External ear normal.  Left Ear: External ear normal.  Bilat tm's with mild erythema.  Max sinus areas mild tender.  Pharynx with mild erythema, no exudate Eyes: . Pupils are equal, round, and reactive to light. Conjunctivae and EOM are normal Neck: Normal range of motion. Neck supple.  Cardiovascular: Normal rate and regular rhythm.   Pulmonary/Chest: Effort normal and breath sounds without rales or wheezing.  Neurological: Pt is alert. Not confused , motor 5/5 intact Spine;  nontender Skin: Skin is warm. No rash, no LE edema Psychiatric: Pt behavior is normal. No agitation.     Assessment & Plan:

## 2016-02-25 NOTE — Assessment & Plan Note (Signed)
Mild to mod, with seasonal flare, cont nasacort, predpac asd,  to f/u any worsening symptoms or concerns

## 2016-02-25 NOTE — Assessment & Plan Note (Signed)
Mild to mod, for antibx course,  to f/u any worsening symptoms or concerns 

## 2016-02-25 NOTE — Assessment & Plan Note (Signed)
Mild to mod, for pain control, predpac asd,  to f/u any worsening symptoms or concerns

## 2016-02-27 ENCOUNTER — Encounter: Payer: Self-pay | Admitting: Internal Medicine

## 2016-02-27 MED ORDER — OSELTAMIVIR PHOSPHATE 75 MG PO CAPS: 75.0000 mg | ORAL_CAPSULE | Freq: Every day | ORAL | Status: DC

## 2016-02-28 ENCOUNTER — Ambulatory Visit: Payer: BLUE CROSS/BLUE SHIELD

## 2016-02-28 DIAGNOSIS — M25511 Pain in right shoulder: Secondary | ICD-10-CM

## 2016-02-28 DIAGNOSIS — M25611 Stiffness of right shoulder, not elsewhere classified: Secondary | ICD-10-CM

## 2016-02-28 DIAGNOSIS — R293 Abnormal posture: Secondary | ICD-10-CM

## 2016-02-28 NOTE — Patient Instructions (Signed)

## 2016-02-28 NOTE — Therapy (Signed)
[facility] [address], [place], [place], [address] Phone: [phone]   Fax:  [phone]  Physical Therapy Treatment  Patient Details  Name: Melody Lewis MRN: [id number] Date of Birth: 1961-04-13 Referring Provider: Hulan Saas, MD  Encounter Date: 02/28/2016      Melody Lewis End of Session - 02/28/16 1308    Visit Number 5   Date for Melody Lewis Re-Evaluation 04/05/16   Melody Lewis Start Time 1234   Melody Lewis Stop Time 1314   Melody Lewis Time Calculation (min) 40 min   Activity Tolerance Patient tolerated treatment well   Behavior During Therapy [facility] for tasks assessed/performed      Past Medical History  Diagnosis Date  . SINUSITIS- ACUTE-NOS 12/22/2009  . ALLERGIC RHINITIS 10/18/2008  . SHOULDER PAIN, LEFT 10/18/2010  . WRIST PAIN, RIGHT 10/18/2008  . KNEE PAIN, RIGHT 10/18/2008  . LOW BACK PAIN 10/18/2008  . LUMBAR RADICULOPATHY, RIGHT 06/28/2009  . BACK PAIN, UPPER 10/18/2008  . BACK PAIN, UPPER 10/18/2008  . UNSPECIFIED NEURALGIA NEURITIS AND RADICULITIS 10/18/2008  . INTERMITTENT VERTIGO 11/01/2009  . Dysuria 10/18/2010  . GERD (gastroesophageal reflux disease) 06/20/2011  . Seasonal allergies   . Allergy   . Blood transfusion without reported diagnosis     Past Surgical History  Procedure Laterality Date  . Tonsillectomy  1987  . Abdominal hysterectomy  05/2005    due to fibroids, still born, ovaries intact  . Colonoscopy  08-04-2009  . Cesarean section  1983  . Dilation and curettage of uterus  2006    There were no vitals filed for this visit.  Visit Diagnosis:  Pain in joint of right shoulder  Posture abnormality  Shoulder stiffness, right      Subjective Assessment - 02/28/16 1236    Subjective Rt shoulder is feeling much better.     Patient Stated Goals reduce Rt shoulder pain, improve Rt shoulder AROM and use, sleep with fewer interruptions   Currently in Pain? Yes   Pain Score 0-No pain  no pain today                          [facility] Adult Melody Lewis Treatment/Exercise - 02/28/16 0001    Shoulder Exercises: Supine   Other Supine Exercises Foam roll x 3 min, B UE into overhead flexion, snowangel x 10   Shoulder Exercises: Standing   Flexion AAROM;10 reps;Right  finger ladder   ABduction AAROM;Right;10 reps  finger ladder   Extension Strengthening;Both;20 reps;Theraband   Theraband Level (Shoulder Extension) Level 2 (Red)   Row Strengthening;Both;20 reps;Theraband   Theraband Level (Shoulder Row) Level 2 (Red)   Shoulder Exercises: Pulleys   Flexion 3 minutes   ABduction 3 minutes   Shoulder Exercises: ROM/Strengthening   UBE (Upper Arm Bike) level 1x 6 minutes (3/3)   Shoulder Exercises: Stretch   Corner Stretch 3 reps;20 seconds   Modalities   Modalities Iontophoresis   Iontophoresis   Type of Iontophoresis Dexamethasone   Location Rt ant shoulder   Dose #4    33ml   Time 6hour patch                Melody Lewis Education - 02/28/16 1315    Education provided Yes   Education Details ionto instructions   Person(s) Educated Patient   Methods Explanation;Demonstration;Handout   Comprehension Verbalized understanding;Returned demonstration          Melody Lewis Short Term Goals - 02/28/16 1240  Melody Lewis SHORT TERM GOAL #3   Title report < or = to 4/10 Rt shoulder pain with use overhead   Status Achieved   Melody Lewis SHORT TERM GOAL #4   Title report postural corrections with sitting at work to improve scapular strength   Status Achieved   Melody Lewis SHORT TERM GOAL #5   Title demonstrate Rt shoulder AROM flexion to > or = to 130 degrees to improve overhead use   Time 4   Period Weeks   Status On-going           Melody Lewis Long Term Goals - 02/09/16 [id number]    Melody Lewis LONG TERM GOAL #1   Title be independent in advanced HEP   Time 8   Period Weeks   Status New   Melody Lewis LONG TERM GOAL #2   Title reduce FOTO to < or = to 36% limitation   Time 8   Period Weeks   Status New   Melody Lewis LONG TERM GOAL #3    Title report < or = to 2/10 Rt shoulder pain with use   Time 8   Period Weeks   Status New   Melody Lewis LONG TERM GOAL #4   Title demonstrate Rt shoulder AROM IR to lacking < or = to 3 inches vs the Lt to improve use with self-care   Time 8   Period Weeks   Status New   Melody Lewis LONG TERM GOAL #5   Title demonstrate Rt shoulder AROM flexion to > or = to 145 degrees to improve overhead use   Time 8   Period Weeks   Status New               Plan - 02/28/16 1240    Clinical Impression Statement Melody Lewis reports no pain in the Rt shoulder now.  She is taking prednisone for upper respiratory infection so this might be helping her shoulder pain.  Melody Lewis with continued limited AROM and will benefit from Melody Lewis for AROM progression and scapular strength to improve scapular position.    Melody Lewis will benefit from skilled therapeutic intervention in order to improve on the following deficits Postural dysfunction;Decreased strength;Impaired flexibility;Pain;Decreased activity tolerance;Increased muscle spasms;Decreased range of motion   Rehab Potential Good   Melody Lewis Frequency 2x / week   Melody Lewis Duration 8 weeks   Melody Lewis Treatment/Interventions ADLs/Self Care Home Management;Cryotherapy;Electrical Stimulation;Iontophoresis 4mg /ml Dexamethasone;Moist Heat;Therapeutic exercise;Therapeutic activities;Ultrasound;Neuromuscular re-education;Patient/family education;Manual techniques;Taping;Dry needling;Passive range of motion   Melody Lewis Next Visit Plan Foam roll, continue ionto, review scapular strength issued today.     Consulted and Agree with Plan of Care Patient        Problem List Patient Active Problem List   Diagnosis Date Noted  . Acute sinus infection 02/24/2016  . Radiculitis of left cervical region 02/24/2016  . Frozen shoulder syndrome 01/03/2016  . Supraclavicular lymphadenopathy 12/26/2015  . Nasal sore 12/26/2015  . Right shoulder pain 03/17/2015  . Cystitis 04/20/2014  . Urinary frequency 03/03/2013  . Stress  incontinence in female 03/03/2013  . Vaginal dryness, menopausal 03/03/2013  . GERD (gastroesophageal reflux disease) 06/20/2011  . Preventative health care 05/04/2011  . LUMBAR RADICULOPATHY, RIGHT 06/28/2009  . Allergic rhinitis 10/18/2008  . LOW BACK PAIN 10/18/2008  . BACK PAIN, UPPER 10/18/2008  Melody Lewis, Melody Lewis 02/28/2016 1:16 PM  [facility] [address] [place], [place], [address] Phone: [phone]   Fax:  [phone]  Name: Melody Lewis MRN: [id number] Date of Birth: [birth year]

## 2016-03-01 ENCOUNTER — Ambulatory Visit: Payer: BLUE CROSS/BLUE SHIELD

## 2016-03-01 DIAGNOSIS — M25511 Pain in right shoulder: Secondary | ICD-10-CM | POA: Diagnosis not present

## 2016-03-01 DIAGNOSIS — R293 Abnormal posture: Secondary | ICD-10-CM

## 2016-03-01 DIAGNOSIS — M25611 Stiffness of right shoulder, not elsewhere classified: Secondary | ICD-10-CM

## 2016-03-01 NOTE — Therapy (Signed)
[facility] [address], [place], [place], [address] Phone: [phone]   Fax:  [phone]  Physical Therapy Treatment  Patient Details  Name: Melody Lewis MRN: [id number] Date of Birth: Nov 09, 1961 Referring Provider: Hulan Saas, MD  Encounter Date: 03/01/2016      PT End of Session - 03/01/16 1305    Visit Number 6   Date for PT Re-Evaluation 04/05/16   PT Start Time 1236   PT Stop Time 1316   PT Time Calculation (min) 40 min   Activity Tolerance Patient tolerated treatment well   Behavior During Therapy [facility] for tasks assessed/performed      Past Medical History  Diagnosis Date  . SINUSITIS- ACUTE-NOS 12/22/2009  . ALLERGIC RHINITIS 10/18/2008  . SHOULDER PAIN, LEFT 10/18/2010  . WRIST PAIN, RIGHT 10/18/2008  . KNEE PAIN, RIGHT 10/18/2008  . LOW BACK PAIN 10/18/2008  . LUMBAR RADICULOPATHY, RIGHT 06/28/2009  . BACK PAIN, UPPER 10/18/2008  . BACK PAIN, UPPER 10/18/2008  . UNSPECIFIED NEURALGIA NEURITIS AND RADICULITIS 10/18/2008  . INTERMITTENT VERTIGO 11/01/2009  . Dysuria 10/18/2010  . GERD (gastroesophageal reflux disease) 06/20/2011  . Seasonal allergies   . Allergy   . Blood transfusion without reported diagnosis     Past Surgical History  Procedure Laterality Date  . Tonsillectomy  1987  . Abdominal hysterectomy  05/2005    due to fibroids, still born, ovaries intact  . Colonoscopy  08-04-2009  . Cesarean section  1983  . Dilation and curettage of uterus  2006    There were no vitals filed for this visit.  Visit Diagnosis:  Pain in joint of right shoulder  Posture abnormality  Shoulder stiffness, right      Subjective Assessment - 03/01/16 1239    Subjective AROM is improving and shoulder is feeling better.     Currently in Pain? No/denies                         [facility] Adult PT Treatment/Exercise - 03/01/16 0001    Shoulder Exercises: Supine   Other Supine Exercises  Foam roll x 3 min, B UE into overhead flexion, snowangel x 10   Shoulder Exercises: Seated   Flexion Strengthening;Right;20 reps   Flexion Weight (lbs) 1   Abduction Strengthening;Right;20 reps;Weights   ABduction Weight (lbs) 1   ABduction Limitations scaption 1# 2x10   Shoulder Exercises: Standing   Flexion AAROM;10 reps;Right  finger ladder   Shoulder Flexion Weight (lbs) 1   ABduction AAROM;Right;10 reps  finger ladder   Shoulder ABduction Weight (lbs) 1   Shoulder Exercises: Pulleys   Flexion 3 minutes   ABduction 3 minutes   Shoulder Exercises: ROM/Strengthening   UBE (Upper Arm Bike) level 1x 6 minutes (3/3)   Modalities   Modalities Iontophoresis   Iontophoresis   Type of Iontophoresis Dexamethasone   Location Rt ant shoulder   Dose #5   59ml   Time 6hour patch                PT Education - 03/01/16 1305    Education provided Yes   Education Details ionto instructions   Person(s) Educated Patient   Methods Explanation;Demonstration;Handout   Comprehension Verbalized understanding;Returned demonstration          PT Short Term Goals - 02/28/16 1240    PT SHORT TERM GOAL #3   Title report < or = to 4/10 Rt shoulder pain with use overhead  Status Achieved   PT SHORT TERM GOAL #4   Title report postural corrections with sitting at work to improve scapular strength   Status Achieved   PT SHORT TERM GOAL #5   Title demonstrate Rt shoulder AROM flexion to > or = to 130 degrees to improve overhead use   Time 4   Period Weeks   Status On-going           PT Long Term Goals - 02/09/16 [id number]    PT LONG TERM GOAL #1   Title be independent in advanced HEP   Time 8   Period Weeks   Status New   PT LONG TERM GOAL #2   Title reduce FOTO to < or = to 36% limitation   Time 8   Period Weeks   Status New   PT LONG TERM GOAL #3   Title report < or = to 2/10 Rt shoulder pain with use   Time 8   Period Weeks   Status New   PT LONG TERM GOAL #4   Title  demonstrate Rt shoulder AROM IR to lacking < or = to 3 inches vs the Lt to improve use with self-care   Time 8   Period Weeks   Status New   PT LONG TERM GOAL #5   Title demonstrate Rt shoulder AROM flexion to > or = to 145 degrees to improve overhead use   Time 8   Period Weeks   Status New               Plan - 03/01/16 1240    Clinical Impression Statement Pt reports 80% overall improvement in Rt shoulder symptoms since the start of care.  Pt reports that she is using her Rt arm fairly normally with ADLs and self-care. Pt tolerated increased weight with exercise today. Pt with continued AROM limitations but these are improving.  Pt will continue to benefit from skilled PT for Rt shoulder AROM and strength progression.     Pt will benefit from skilled therapeutic intervention in order to improve on the following deficits Postural dysfunction;Decreased strength;Impaired flexibility;Pain;Decreased activity tolerance;Increased muscle spasms;Decreased range of motion   Rehab Potential Good   PT Frequency 2x / week   PT Duration 8 weeks   PT Treatment/Interventions ADLs/Self Care Home Management;Cryotherapy;Electrical Stimulation;Iontophoresis 4mg /ml Dexamethasone;Moist Heat;Therapeutic exercise;Therapeutic activities;Ultrasound;Neuromuscular re-education;Patient/family education;Manual techniques;Taping;Dry needling;Passive range of motion   PT Next Visit Plan Foam roll, one more ionto patch, review scapular strength issued today.     Consulted and Agree with Plan of Care Patient        Problem List Patient Active Problem List   Diagnosis Date Noted  . Acute sinus infection 02/24/2016  . Radiculitis of left cervical region 02/24/2016  . Frozen shoulder syndrome 01/03/2016  . Supraclavicular lymphadenopathy 12/26/2015  . Nasal sore 12/26/2015  . Right shoulder pain 03/17/2015  . Cystitis 04/20/2014  . Urinary frequency 03/03/2013  . Stress incontinence in female 03/03/2013  .  Vaginal dryness, menopausal 03/03/2013  . GERD (gastroesophageal reflux disease) 06/20/2011  . Preventative health care 05/04/2011  . LUMBAR RADICULOPATHY, RIGHT 06/28/2009  . Allergic rhinitis 10/18/2008  . LOW BACK PAIN 10/18/2008  . BACK PAIN, UPPER 10/18/2008    Sigurd Sos, PT 03/01/2016 1:07 PM  [facility] [address] [place], [place], [address] Phone: [phone]   Fax:  [phone]  Name: Melody Lewis MRN: [id number] Date of Birth: Sep 30, 1961

## 2016-03-01 NOTE — Patient Instructions (Signed)

## 2016-03-06 ENCOUNTER — Ambulatory Visit: Payer: BLUE CROSS/BLUE SHIELD

## 2016-03-06 DIAGNOSIS — R293 Abnormal posture: Secondary | ICD-10-CM

## 2016-03-06 DIAGNOSIS — M25511 Pain in right shoulder: Secondary | ICD-10-CM

## 2016-03-06 DIAGNOSIS — M25611 Stiffness of right shoulder, not elsewhere classified: Secondary | ICD-10-CM

## 2016-03-06 NOTE — Therapy (Signed)
[facility] [address], [place], [place], [address] Phone: [phone]   Fax:  [phone]  Physical Therapy Treatment  Patient Details  Name: Melody Lewis MRN: [id number] Date of Birth: 1961/10/23 Referring Provider: Hulan Saas, MD  Encounter Date: 03/06/2016      PT End of Session - 03/06/16 1306    Visit Number 7   Date for PT Re-Evaluation 04/05/16   PT Start Time 1243  pt was late for appt   PT Stop Time 1314   PT Time Calculation (min) 31 min   Activity Tolerance Patient tolerated treatment well   Behavior During Therapy [facility] for tasks assessed/performed      Past Medical History  Diagnosis Date  . SINUSITIS- ACUTE-NOS 12/22/2009  . ALLERGIC RHINITIS 10/18/2008  . SHOULDER PAIN, LEFT 10/18/2010  . WRIST PAIN, RIGHT 10/18/2008  . KNEE PAIN, RIGHT 10/18/2008  . LOW BACK PAIN 10/18/2008  . LUMBAR RADICULOPATHY, RIGHT 06/28/2009  . BACK PAIN, UPPER 10/18/2008  . BACK PAIN, UPPER 10/18/2008  . UNSPECIFIED NEURALGIA NEURITIS AND RADICULITIS 10/18/2008  . INTERMITTENT VERTIGO 11/01/2009  . Dysuria 10/18/2010  . GERD (gastroesophageal reflux disease) 06/20/2011  . Seasonal allergies   . Allergy   . Blood transfusion without reported diagnosis     Past Surgical History  Procedure Laterality Date  . Tonsillectomy  1987  . Abdominal hysterectomy  05/2005    due to fibroids, still born, ovaries intact  . Colonoscopy  08-04-2009  . Cesarean section  1983  . Dilation and curettage of uterus  2006    There were no vitals filed for this visit.  Visit Diagnosis:  Pain in joint of right shoulder  Posture abnormality  Shoulder stiffness, right      Subjective Assessment - 03/06/16 1244    Subjective 13 minutes late for appt.  Pt reports Rt shoulder aching last night.  No pain now.     Pertinent History cortizone injection 06/2015 into Rt shoulder   Diagnostic tests x-ray: pt reports bursitis in Rt shoulder    Patient Stated Goals reduce Rt shoulder pain, improve Rt shoulder AROM and use, sleep with fewer interruptions   Currently in Pain? No/denies                         [facility] Adult PT Treatment/Exercise - 03/06/16 0001    Shoulder Exercises: Supine   Horizontal ABduction Strengthening;Both;Theraband;20 reps   Theraband Level (Shoulder Horizontal ABduction) Level 2 (Red)   Other Supine Exercises Foam roll x 3 min, B UE into overhead flexion, snowangel x 10   Shoulder Exercises: Seated   Flexion Strengthening;Right;20 reps   Flexion Weight (lbs) 1   Abduction Strengthening;Right;20 reps;Weights   ABduction Weight (lbs) 1   ABduction Limitations scaption 1# 2x10   Shoulder Exercises: Standing   Flexion AAROM;10 reps;Right  finger ladder   Shoulder Flexion Weight (lbs) 1   ABduction AAROM;Right;10 reps  finger ladder   Shoulder ABduction Weight (lbs) 1   Shoulder Exercises: ROM/Strengthening   UBE (Upper Arm Bike) level 1x 8 minutes (4/4)   Modalities   Modalities Iontophoresis   Iontophoresis   Type of Iontophoresis Dexamethasone   Location Rt ant shoulder   Dose #6   35ml   Time 6hour patch                PT Education - 03/06/16 1258    Education provided Yes   Education Details  ionto instructions   Person(s) Educated Patient   Methods Explanation;Handout   Comprehension Verbalized understanding          PT Short Term Goals - 02/28/16 1240    PT SHORT TERM GOAL #3   Title report < or = to 4/10 Rt shoulder pain with use overhead   Status Achieved   PT SHORT TERM GOAL #4   Title report postural corrections with sitting at work to improve scapular strength   Status Achieved   PT SHORT TERM GOAL #5   Title demonstrate Rt shoulder AROM flexion to > or = to 130 degrees to improve overhead use   Time 4   Period Weeks   Status On-going           PT Long Term Goals - 03/06/16 1245    PT LONG TERM GOAL #1   Title be independent in advanced  HEP   Time 8   Period Weeks   Status On-going   PT LONG TERM GOAL #3   Title report < or = to 2/10 Rt shoulder pain with use   Time 8   Period Weeks   Status Achieved               Plan - 03/06/16 1246    Clinical Impression Statement Pt reports 80% overall improvment in Rt shoulder symptoms since the start of care.  Pt is using Rt shoulder fairly normally with ADLs and self-care.  Pt reports 2/10 Rt shoulder pain with use.  Pt with continued AROM limitation in the Rt UE that are improving.  Pt will continue to benefit from skilled PT for Rt shoulder AROM and strength progression.     Pt will benefit from skilled therapeutic intervention in order to improve on the following deficits Postural dysfunction;Decreased strength;Impaired flexibility;Pain;Decreased activity tolerance;Increased muscle spasms;Decreased range of motion   Rehab Potential Good   PT Frequency 2x / week   PT Duration 8 weeks   PT Treatment/Interventions ADLs/Self Care Home Management;Cryotherapy;Electrical Stimulation;Iontophoresis 4mg /ml Dexamethasone;Moist Heat;Therapeutic exercise;Therapeutic activities;Ultrasound;Neuromuscular re-education;Patient/family education;Manual techniques;Taping;Dry needling;Passive range of motion   PT Next Visit Plan Foam roll, Rt shoulder AROM, scapular strength, AROM and strength progression. cone stacking, pulleys, finger ladder   Consulted and Agree with Plan of Care Patient        Problem List Patient Active Problem List   Diagnosis Date Noted  . Acute sinus infection 02/24/2016  . Radiculitis of left cervical region 02/24/2016  . Frozen shoulder syndrome 01/03/2016  . Supraclavicular lymphadenopathy 12/26/2015  . Nasal sore 12/26/2015  . Right shoulder pain 03/17/2015  . Cystitis 04/20/2014  . Urinary frequency 03/03/2013  . Stress incontinence in female 03/03/2013  . Vaginal dryness, menopausal 03/03/2013  . GERD (gastroesophageal reflux disease) 06/20/2011  .  Preventative health care 05/04/2011  . LUMBAR RADICULOPATHY, RIGHT 06/28/2009  . Allergic rhinitis 10/18/2008  . LOW BACK PAIN 10/18/2008  . BACK PAIN, UPPER 10/18/2008    Sigurd Sos, PT 03/06/2016 1:08 PM  [facility] [address] [place], [place], [address] Phone: [phone]   Fax:  [phone]  Name: Melody Lewis MRN: [id number] Date of Birth: 02-01-1961

## 2016-03-06 NOTE — Patient Instructions (Signed)

## 2016-03-08 ENCOUNTER — Encounter: Payer: BLUE CROSS/BLUE SHIELD | Admitting: Physical Therapy

## 2016-03-13 ENCOUNTER — Encounter: Payer: Self-pay | Admitting: Physical Therapy

## 2016-03-13 ENCOUNTER — Ambulatory Visit: Payer: BLUE CROSS/BLUE SHIELD | Attending: Family Medicine | Admitting: Physical Therapy

## 2016-03-13 DIAGNOSIS — M6281 Muscle weakness (generalized): Secondary | ICD-10-CM | POA: Insufficient documentation

## 2016-03-13 DIAGNOSIS — M25511 Pain in right shoulder: Secondary | ICD-10-CM | POA: Diagnosis not present

## 2016-03-13 DIAGNOSIS — M25611 Stiffness of right shoulder, not elsewhere classified: Secondary | ICD-10-CM

## 2016-03-13 DIAGNOSIS — R293 Abnormal posture: Secondary | ICD-10-CM

## 2016-03-13 NOTE — Therapy (Signed)
[facility] [address], [place], [place], [address] Phone: [phone]   Fax:  [phone]  Physical Therapy Treatment  Patient Details  Name: Melody Lewis MRN: [id number] Date of Birth: 01-27-1961 Referring Provider: Hulan Saas, MD  Encounter Date: 03/13/2016      PT End of Session - 03/13/16 1303    Visit Number 8   Date for PT Re-Evaluation 04/05/16   PT Start Time 1237   PT Stop Time 1315   PT Time Calculation (min) 38 min   Activity Tolerance Patient tolerated treatment well   Behavior During Therapy [facility] for tasks assessed/performed      Past Medical History  Diagnosis Date  . SINUSITIS- ACUTE-NOS 12/22/2009  . ALLERGIC RHINITIS 10/18/2008  . SHOULDER PAIN, LEFT 10/18/2010  . WRIST PAIN, RIGHT 10/18/2008  . KNEE PAIN, RIGHT 10/18/2008  . LOW BACK PAIN 10/18/2008  . LUMBAR RADICULOPATHY, RIGHT 06/28/2009  . BACK PAIN, UPPER 10/18/2008  . BACK PAIN, UPPER 10/18/2008  . UNSPECIFIED NEURALGIA NEURITIS AND RADICULITIS 10/18/2008  . INTERMITTENT VERTIGO 11/01/2009  . Dysuria 10/18/2010  . GERD (gastroesophageal reflux disease) 06/20/2011  . Seasonal allergies   . Allergy   . Blood transfusion without reported diagnosis     Past Surgical History  Procedure Laterality Date  . Tonsillectomy  1987  . Abdominal hysterectomy  05/2005    due to fibroids, still born, ovaries intact  . Colonoscopy  08-04-2009  . Cesarean section  1983  . Dilation and curettage of uterus  2006    There were no vitals filed for this visit.  Visit Diagnosis:  Pain in joint of right shoulder  Posture abnormality  Shoulder stiffness, right      Subjective Assessment - 03/13/16 1243    Subjective Pt without complain of pain, but with reaching in upper cabinets or under bed pain in Rt shoulder is 3/10   Pertinent History cortizone injection 06/2015 into Rt shoulder   Diagnostic tests x-ray: pt reports bursitis in Rt shoulder   Patient Stated Goals reduce Rt shoulder pain, improve Rt shoulder AROM and use, sleep with fewer interruptions   Currently in Pain? No/denies                         [facility] Adult PT Treatment/Exercise - 03/13/16 0001    Posture/Postural Control   Posture/Postural Control Postural limitations   Postural Limitations Forward head;Rounded Shoulders   Exercises   Exercises Shoulder   Shoulder Exercises: Supine   Horizontal ABduction Strengthening;Both;Theraband;20 reps   Theraband Level (Shoulder Horizontal ABduction) Level 2 (Red)  on FR   Other Supine Exercises Foam roll x 3 min, B UE into overhead flexion,x 10 snowangel 2 x 10   Shoulder Exercises: ROM/Strengthening   UBE (Upper Arm Bike) level 1x 8 minutes (4/4)   Shoulder Exercises: Stretch   Corner Stretch 3 reps;20 seconds   Wall Stretch - ABduction 5 reps  x 2 with leaning into wall   Other Shoulder Stretches Wall clock x 6 with Rt UE   Manual Therapy   Manual Therapy Soft tissue mobilization;Joint mobilization   Manual therapy comments Gentle crossfriction to rt ant shoulder   Joint Mobilization PROM Rt shoulder , caudal & posterior glide                  PT Short Term Goals - 02/28/16 1240    PT SHORT TERM GOAL #3   Title report <  or = to 4/10 Rt shoulder pain with use overhead   Status Achieved   PT SHORT TERM GOAL #4   Title report postural corrections with sitting at work to improve scapular strength   Status Achieved   PT SHORT TERM GOAL #5   Title demonstrate Rt shoulder AROM flexion to > or = to 130 degrees to improve overhead use   Time 4   Period Weeks   Status On-going           PT Long Term Goals - 03/13/16 1249    PT LONG TERM GOAL #1   Title be independent in advanced HEP   Time 8   Period Weeks   Status On-going   PT LONG TERM GOAL #2   Title reduce FOTO to < or = to 36% limitation   Time 8   Period Weeks   Status On-going   PT LONG TERM GOAL #3   Title report < or  = to 2/10 Rt shoulder pain with use   Time 8   Period Weeks   Status Achieved   PT LONG TERM GOAL #4   Title demonstrate Rt shoulder AROM IR to lacking < or = to 3 inches vs the Lt to improve use with self-care   Time 8   Period Weeks   Status On-going   PT LONG TERM GOAL #5   Title demonstrate Rt shoulder AROM flexion to > or = to 145 degrees to improve overhead use   Time 8   Period Weeks   Status On-going               Plan - 03/13/16 1244    Clinical Impression Statement Pt reports all over 80% improvement, but still limited with reaching or motions involving flexion and add/abd    Pt will benefit from skilled therapeutic intervention in order to improve on the following deficits Postural dysfunction;Decreased strength;Impaired flexibility;Pain;Decreased activity tolerance;Increased muscle spasms;Decreased range of motion   Rehab Potential Good   PT Frequency 2x / week   PT Duration 8 weeks   PT Treatment/Interventions ADLs/Self Care Home Management;Cryotherapy;Electrical Stimulation;Iontophoresis 4mg /ml Dexamethasone;Moist Heat;Therapeutic exercise;Therapeutic activities;Ultrasound;Neuromuscular re-education;Patient/family education;Manual techniques;Taping;Dry needling;Passive range of motion   PT Next Visit Plan Foam roll, Rt shoulder AROM, scapular strength, AROM and strength progression. cone stacking, pulleys, manual therapy   Consulted and Agree with Plan of Care Patient        Problem List Patient Active Problem List   Diagnosis Date Noted  . Acute sinus infection 02/24/2016  . Radiculitis of left cervical region 02/24/2016  . Frozen shoulder syndrome 01/03/2016  . Supraclavicular lymphadenopathy 12/26/2015  . Nasal sore 12/26/2015  . Right shoulder pain 03/17/2015  . Cystitis 04/20/2014  . Urinary frequency 03/03/2013  . Stress incontinence in female 03/03/2013  . Vaginal dryness, menopausal 03/03/2013  . GERD (gastroesophageal reflux disease)  06/20/2011  . Preventative health care 05/04/2011  . LUMBAR RADICULOPATHY, RIGHT 06/28/2009  . Allergic rhinitis 10/18/2008  . LOW BACK PAIN 10/18/2008  . BACK PAIN, UPPER 10/18/2008    NAUMANN-HOUEGNIFIO,Ron Beske PTA 03/13/2016, 1:27 PM  [facility] [address], [place], [place], [address] Phone: [phone]   Fax:  [phone]  Name: Melody Lewis MRN: [id number] Date of Birth: 10/22/1961

## 2016-03-15 ENCOUNTER — Ambulatory Visit: Payer: BLUE CROSS/BLUE SHIELD

## 2016-03-15 DIAGNOSIS — M25511 Pain in right shoulder: Secondary | ICD-10-CM | POA: Diagnosis not present

## 2016-03-15 DIAGNOSIS — R293 Abnormal posture: Secondary | ICD-10-CM

## 2016-03-15 DIAGNOSIS — M25611 Stiffness of right shoulder, not elsewhere classified: Secondary | ICD-10-CM

## 2016-03-15 NOTE — Therapy (Signed)
[facility] [address] [place], [place], [address] Phone: [phone]   Fax:  [phone]  Physical Therapy Treatment  Patient Details  Name: Melody Lewis MRN: [id number] Date of Birth: 07/20/1961 Referring Provider: Hulan Saas, MD  Encounter Date: 03/15/2016      PT End of Session - 03/15/16 1311    Visit Number 9   Date for PT Re-Evaluation 04/05/16   PT Start Time 1240  Pt 10 minutes late   PT Stop Time 1312   PT Time Calculation (min) 32 min   Activity Tolerance Patient tolerated treatment well   Behavior During Therapy [facility] for tasks assessed/performed      Past Medical History  Diagnosis Date  . SINUSITIS- ACUTE-NOS 12/22/2009  . ALLERGIC RHINITIS 10/18/2008  . SHOULDER PAIN, LEFT 10/18/2010  . WRIST PAIN, RIGHT 10/18/2008  . KNEE PAIN, RIGHT 10/18/2008  . LOW BACK PAIN 10/18/2008  . LUMBAR RADICULOPATHY, RIGHT 06/28/2009  . BACK PAIN, UPPER 10/18/2008  . BACK PAIN, UPPER 10/18/2008  . UNSPECIFIED NEURALGIA NEURITIS AND RADICULITIS 10/18/2008  . INTERMITTENT VERTIGO 11/01/2009  . Dysuria 10/18/2010  . GERD (gastroesophageal reflux disease) 06/20/2011  . Seasonal allergies   . Allergy   . Blood transfusion without reported diagnosis     Past Surgical History  Procedure Laterality Date  . Tonsillectomy  1987  . Abdominal hysterectomy  05/2005    due to fibroids, still born, ovaries intact  . Colonoscopy  08-04-2009  . Cesarean section  1983  . Dilation and curettage of uterus  2006    There were no vitals filed for this visit.  Visit Diagnosis:  Posture abnormality  Shoulder stiffness, right  Pain in joint of right shoulder      Subjective Assessment - 03/15/16 1250    Subjective Pt reports that Lt shoulder is feeling better.  Wants to get back to full AROM especially into abduction   Pertinent History Pt does not want to do foam roll in supine as it makes her dizzy   Currently in Pain?  No/denies            [facility] PT Assessment - 03/15/16 0001    Observation/Other Assessments   Focus on Therapeutic Outcomes (FOTO)  35% limitation                     [facility] Adult PT Treatment/Exercise - 03/15/16 0001    Shoulder Exercises: Seated   Horizontal ABduction Strengthening;Both;20 reps;Theraband   Theraband Level (Shoulder Horizontal ABduction) Level 2 (Red)   Flexion Strengthening;Right;20 reps   Flexion Weight (lbs) 1   Abduction Strengthening;Right;20 reps;Weights   ABduction Weight (lbs) 1   ABduction Limitations scaption 1# 2x10   Shoulder Exercises: Standing   Flexion AAROM;10 reps;Right  finger ladder   Shoulder Flexion Weight (lbs) 1   ABduction AAROM;Right;10 reps  finger ladder   Shoulder ABduction Weight (lbs) 1   Other Standing Exercises standing with back against the wall: snow angels with arms touching the wall   Shoulder Exercises: ROM/Strengthening   UBE (Upper Arm Bike) level 1x 6 minutes (4/4)  less time today due to being late                  PT Short Term Goals - 02/28/16 1240    PT SHORT TERM GOAL #3   Title report < or = to 4/10 Rt shoulder pain with use overhead   Status Achieved   PT SHORT  TERM GOAL #4   Title report postural corrections with sitting at work to improve scapular strength   Status Achieved   PT SHORT TERM GOAL #5   Title demonstrate Rt shoulder AROM flexion to > or = to 130 degrees to improve overhead use   Time 4   Period Weeks   Status On-going           PT Long Term Goals - 03/15/16 1251    PT LONG TERM GOAL #2   Title reduce FOTO to < or = to 36% limitation   Status Achieved               Plan - 03/15/16 1254    Clinical Impression Statement Pt reports 80% overall improvement since the start of care.  Pt with continued challenge and limited AROM with end range abduction on the Rt.  Pt did well with theraband in sitting vs supine.  FOTO score is improved to 35% limitation (46%  at evaluation).  Pt will continue to benefit from skilled PT for Rt shoulder strength and flexibility.     Pt will benefit from skilled therapeutic intervention in order to improve on the following deficits Postural dysfunction;Decreased strength;Impaired flexibility;Pain;Decreased activity tolerance;Increased muscle spasms;Decreased range of motion   Rehab Potential Good   PT Frequency 2x / week   PT Duration 8 weeks   PT Treatment/Interventions ADLs/Self Care Home Management;Cryotherapy;Electrical Stimulation;Iontophoresis 4mg /ml Dexamethasone;Moist Heat;Therapeutic exercise;Therapeutic activities;Ultrasound;Neuromuscular re-education;Patient/family education;Manual techniques;Taping;Dry needling;Passive range of motion   PT Next Visit Plan Rt shoulder AROM, strength, joint mobs, soft tissue mobilization   Consulted and Agree with Plan of Care Patient        Problem List Patient Active Problem List   Diagnosis Date Noted  . Acute sinus infection 02/24/2016  . Radiculitis of left cervical region 02/24/2016  . Frozen shoulder syndrome 01/03/2016  . Supraclavicular lymphadenopathy 12/26/2015  . Nasal sore 12/26/2015  . Right shoulder pain 03/17/2015  . Cystitis 04/20/2014  . Urinary frequency 03/03/2013  . Stress incontinence in female 03/03/2013  . Vaginal dryness, menopausal 03/03/2013  . GERD (gastroesophageal reflux disease) 06/20/2011  . Preventative health care 05/04/2011  . LUMBAR RADICULOPATHY, RIGHT 06/28/2009  . Allergic rhinitis 10/18/2008  . LOW BACK PAIN 10/18/2008  . BACK PAIN, UPPER 10/18/2008    Sigurd Sos, PT 03/15/2016 1:13 PM  [facility] [address] [place], [place], [address] Phone: [phone]   Fax:  [phone]  Name: Melody Lewis MRN: [id number] Date of Birth: Oct 26, 1961

## 2016-03-18 ENCOUNTER — Ambulatory Visit (HOSPITAL_COMMUNITY)
Admission: RE | Admit: 2016-03-18 | Discharge: 2016-03-18 | Disposition: A | Discharge status: Home / Self Care | Payer: BLUE CROSS/BLUE SHIELD | Source: Ambulatory Visit | Attending: Emergency Medicine | Admitting: Emergency Medicine

## 2016-03-18 ENCOUNTER — Emergency Department (HOSPITAL_COMMUNITY)
Admission: EM | Admit: 2016-03-18 | Discharge: 2016-03-18 | Disposition: A | Discharge status: Home / Self Care | Payer: BLUE CROSS/BLUE SHIELD | Source: Home / Self Care | Attending: Emergency Medicine | Admitting: Emergency Medicine

## 2016-03-18 ENCOUNTER — Encounter (HOSPITAL_COMMUNITY): Payer: Self-pay | Admitting: Oncology

## 2016-03-18 DIAGNOSIS — M79605 Pain in left leg: Secondary | ICD-10-CM | POA: Insufficient documentation

## 2016-03-18 DIAGNOSIS — Z79899 Other long term (current) drug therapy: Secondary | ICD-10-CM | POA: Insufficient documentation

## 2016-03-18 DIAGNOSIS — Z8719 Personal history of other diseases of the digestive system: Secondary | ICD-10-CM

## 2016-03-18 DIAGNOSIS — M79609 Pain in unspecified limb: Secondary | ICD-10-CM

## 2016-03-18 DIAGNOSIS — R252 Cramp and spasm: Secondary | ICD-10-CM | POA: Diagnosis not present

## 2016-03-18 DIAGNOSIS — Z8709 Personal history of other diseases of the respiratory system: Secondary | ICD-10-CM | POA: Insufficient documentation

## 2016-03-18 MED ORDER — NAPROXEN 500 MG PO TABS
500.0000 mg | ORAL_TABLET | Freq: Once | ORAL | Status: AC
  Administered 2016-03-18: 500 mg via ORAL
  Filled 2016-03-18: qty 1

## 2016-03-18 MED ORDER — ACETAMINOPHEN 500 MG PO TABS
1000.0000 mg | ORAL_TABLET | Freq: Once | ORAL | Status: AC
  Administered 2016-03-18: 1000 mg via ORAL
  Filled 2016-03-18: qty 2

## 2016-03-18 MED ORDER — IBUPROFEN 400 MG PO TABS: 400.0000 mg | ORAL_TABLET | Freq: Four times a day (QID) | ORAL | Status: DC | PRN

## 2016-03-18 NOTE — Progress Notes (Signed)
VASCULAR LAB PRELIMINARY  PRELIMINARY  PRELIMINARY  PRELIMINARY  Left lower extremity venous duplex completed.    Preliminary report:  Left:  No evidence of DVT, superficial thrombosis, or Baker's cyst.  Keevin Panebianco, RVT 03/18/2016, 8:45 AM

## 2016-03-18 NOTE — ED Provider Notes (Signed)
CSN: [id number]     Arrival date & time 03/18/16  0043 History  By signing my name below, I, Melody Lewis, attest that this documentation has been prepared under the direction and in the presence of Kanyla Omeara, MD. Electronically Signed: Hansel Lewis, ED Scribe. 03/18/2016. 3:36 AM.    Chief Complaint  Patient presents with  . Leg Pain   Patient is a 55 y.o. female presenting with leg pain. The history is provided by the patient. No language interpreter was used.  Leg Pain Location:  Leg Time since incident:  1 day Injury: no   Leg location:  L lower leg Pain details:    Quality:  Cramping   Radiates to:  Does not radiate   Severity:  No pain   Onset quality:  Gradual   Timing:  Intermittent   Progression:  Improving Chronicity:  New Dislocation: no   Foreign body present:  No foreign bodies Prior injury to area:  No Relieved by:  None tried Exacerbated by: certain positions. Ineffective treatments:  None tried Associated symptoms: no decreased ROM, no fever, no muscle weakness, no numbness and no swelling   Risk factors: no recent illness    HPI Comments: Melody Lewis is a 55 y.o. female who presents to the Emergency Department complaining of moderate, intermittent left calf cramping onset this evening. Pt states that cramping is worsened with certain foot positions and upon waking up from sleep. Pt denies taking OTC medications at home to improve symptoms. She reports recent long car travel from [place]. Ambulatory without difficulty. Denies leg swelling, numbness. Denies recent injury, trauma or fall.   Past Medical History  Diagnosis Date  . SINUSITIS- ACUTE-NOS 12/22/2009  . ALLERGIC RHINITIS 10/18/2008  . SHOULDER PAIN, LEFT 10/18/2010  . WRIST PAIN, RIGHT 10/18/2008  . KNEE PAIN, RIGHT 10/18/2008  . LOW BACK PAIN 10/18/2008  . LUMBAR RADICULOPATHY, RIGHT 06/28/2009  . BACK PAIN, UPPER 10/18/2008  . BACK PAIN, UPPER 10/18/2008  . UNSPECIFIED NEURALGIA NEURITIS AND  RADICULITIS 10/18/2008  . INTERMITTENT VERTIGO 11/01/2009  . Dysuria 10/18/2010  . GERD (gastroesophageal reflux disease) 06/20/2011  . Seasonal allergies   . Allergy   . Blood transfusion without reported diagnosis    Past Surgical History  Procedure Laterality Date  . Tonsillectomy  1987  . Abdominal hysterectomy  05/2005    due to fibroids, still born, ovaries intact  . Colonoscopy  08-04-2009  . Cesarean section  1983  . Dilation and curettage of uterus  2006   Family History  Problem Relation Age of Onset  . Arthritis Mother   . Kidney disease Mother   . Hypertension Mother   . Arthritis Father   . Diabetes Father   . Parkinsonism Father   . Dementia Father   . Colon cancer Father   . Hypertension Brother   . Hypertension Daughter   . Colon cancer Maternal Aunt   . Rectal cancer Neg Hx   . Stomach cancer Neg Hx    Social History  Substance Use Topics  . Smoking status: Never Smoker   . Smokeless tobacco: Never Used  . Alcohol Use: No   OB History    No data available     Review of Systems  Constitutional: Negative for fever.  Cardiovascular: Negative for leg swelling.  Musculoskeletal: Positive for myalgias ( left calf ).  Neurological: Negative for numbness.  All other systems reviewed and are negative.  Allergies  Sulfamethoxazole-trimethoprim  Home Medications   Prior  to Admission medications   Medication Sig Start Date End Date Taking? Authorizing Provider  Cholecalciferol (VITAMIN D) 2000 units CAPS Take 2,000 Units by mouth daily.   Yes Historical Provider, MD  cyclobenzaprine (FLEXERIL) 5 MG tablet Take 1 tablet (5 mg total) by mouth 3 (three) times daily as needed for muscle spasms. 01/25/15  Yes Biagio Borg, MD  Diclofenac Sodium (PENNSAID) 2 % SOLN Place 2 application onto the skin 2 (two) times daily. Patient taking differently: Place 2 application onto the skin 2 (two) times daily as needed (pain).  01/03/16  Yes Lyndal Pulley, DO   hydroxypropyl methylcellulose / hypromellose (ISOPTO TEARS / GONIOVISC) 2.5 % ophthalmic solution Place 1 drop into both eyes 3 (three) times daily as needed for dry eyes.   Yes Historical Provider, MD  TURMERIC PO Take 1 capsule by mouth daily.   Yes Historical Provider, MD  aspirin EC 81 MG tablet Take 1 tablet (81 mg total) by mouth daily. Patient not taking: Reported on 03/18/2016 12/26/15   Biagio Borg, MD  azithromycin [facility] Z-PAK) 250 MG tablet Use as directed Patient not taking: Reported on 03/18/2016 02/24/16   Biagio Borg, MD  gabapentin (NEURONTIN) 100 MG capsule One pill every eight hours as needed; dose may be increased by one pill each dose after 72 hours if only partially effective Patient not taking: Reported on 03/18/2016 12/25/14   Hendricks Limes, MD  HYDROcodone-homatropine [facility]) 5-1.5 MG/5ML syrup Take 5 mLs by mouth every 6 (six) hours as needed for cough. Patient not taking: Reported on 03/18/2016 02/24/16   Biagio Borg, MD  levocetirizine (XYZAL) 5 MG tablet Take 1 tablet (5 mg total) by mouth every evening. As needed 01/13/16   Biagio Borg, MD  mupirocin ointment (BACTROBAN) 2 % Place 1 application into the nose 2 (two) times daily. Patient not taking: Reported on 03/18/2016 12/26/15   Biagio Borg, MD  oseltamivir (TAMIFLU) 75 MG capsule Take 1 capsule (75 mg total) by mouth daily. Patient not taking: Reported on 03/18/2016 02/27/16   Hoyt Koch, MD  predniSONE (DELTASONE) 10 MG tablet 3 tabs by mouth per day for 3 days,2tabs per day for 3 days,1tab per day for 3 days Patient not taking: Reported on 03/18/2016 02/24/16   Biagio Borg, MD  triamcinolone (NASACORT AQ) 55 MCG/ACT AERO nasal inhaler Place 2 sprays into the nose daily. 12/26/15   Biagio Borg, MD  valACYclovir (VALTREX) 1000 MG tablet Take 2 tablets (2,000 mg total) by mouth 2 (two) times daily. For one day for outbreak of sores Patient not taking: Reported on 03/18/2016 02/14/16   Binnie Rail, MD   BP  145/69 mmHg  Pulse 96  Temp(Src) 97.9 F (36.6 C) (Oral)  Resp 15  Ht 5\' 4"  (1.626 m)  Wt 232 lb (105.235 kg)  BMI 39.80 kg/m2  SpO2 100% Physical Exam  Constitutional: She is oriented to person, place, and time. She appears well-developed and well-nourished.  HENT:  Head: Normocephalic and atraumatic.  Mouth/Throat: Oropharynx is clear and moist. No oropharyngeal exudate.  Eyes: Conjunctivae and EOM are normal. Pupils are equal, round, and reactive to light.  Neck: Normal range of motion. Neck supple. No JVD present. No tracheal deviation present.  Cardiovascular: Normal rate, regular rhythm and normal heart sounds.  Exam reveals no gallop and no friction rub.   No murmur heard. RRR.   Pulmonary/Chest: Effort normal and breath sounds normal. No stridor. No respiratory  distress. She has no wheezes. She has no rales.  Lungs CTA bilaterally.   Abdominal: Soft. Bowel sounds are normal. She exhibits no distension. There is no tenderness. There is no rebound and no guarding.  Musculoskeletal: Normal range of motion.  3+ DP and PT bilaterally. Spasm in left gastrocnemius. Negative homen's sign. No chords. All compartments are soft.   Lymphadenopathy:    She has no cervical adenopathy.  Neurological: She is alert and oriented to person, place, and time. She has normal reflexes.  Skin: Skin is warm and dry.  Psychiatric: She has a normal mood and affect.  Nursing note and vitals reviewed.   ED Course  Procedures (including critical care time) DIAGNOSTIC STUDIES: Oxygen Saturation is 100% on RA, normal by my interpretation.    COORDINATION OF CARE: 3:31 AM Discussed treatment plan with pt at bedside which includes conservative home therapy and pt agreed to plan.    MDM   Final diagnoses:  None   BP 145/69 mmHg  Pulse 96  Temp(Src) 97.9 F (36.6 C) (Oral)  Resp 15  Ht 5\' 4"  (1.626 m)  Wt 232 lb (105.235 kg)  BMI 39.80 kg/m2  SpO2 100%   Results for orders placed or  performed in visit on 12/26/15  Hepatitis C Antibody  Result Value Ref Range   HCV Ab NEGATIVE NEGATIVE  Lipid panel  Result Value Ref Range   Cholesterol 164 0 - 200 mg/dL   Triglycerides 72.0 0.0 - 149.0 mg/dL   HDL 54.70 >39.00 mg/dL   VLDL 14.4 0.0 - 40.0 mg/dL   LDL Cholesterol 95 0 - 99 mg/dL   Total CHOL/HDL Ratio 3    NonHDL [id number]   Basic metabolic panel  Result Value Ref Range   Sodium 143 135 - 145 mEq/L   Potassium 4.2 3.5 - 5.1 mEq/L   Chloride 105 96 - 112 mEq/L   CO2 28 19 - 32 mEq/L   Glucose, Bld 86 70 - 99 mg/dL   BUN 15 6 - 23 mg/dL   Creatinine, Ser 0.79 0.40 - 1.20 mg/dL   Calcium 9.7 8.4 - 10.5 mg/dL   GFR 96.91 >60.00 mL/min  Hepatic function panel  Result Value Ref Range   Total Bilirubin 0.3 0.2 - 1.2 mg/dL   Bilirubin, Direct 0.0 0.0 - 0.3 mg/dL   Alkaline Phosphatase 71 39 - 117 U/L   AST 15 0 - 37 U/L   ALT 15 0 - 35 U/L   Total Protein 8.1 6.0 - 8.3 g/dL   Albumin 4.3 3.5 - 5.2 g/dL  CBC with Differential/Platelet  Result Value Ref Range   WBC 6.8 4.0 - 10.5 K/uL   RBC 4.19 3.87 - 5.11 Mil/uL   Hemoglobin 12.5 12.0 - 15.0 g/dL   HCT 38.0 36.0 - 46.0 %   MCV 90.8 78.0 - 100.0 fl   MCHC 32.9 30.0 - 36.0 g/dL   RDW 13.2 11.5 - 15.5 %   Platelets 261.0 150.0 - 400.0 K/uL   Neutrophils Relative % 57.9 43.0 - 77.0 %   Lymphocytes Relative 28.3 12.0 - 46.0 %   Monocytes Relative 9.8 3.0 - 12.0 %   Eosinophils Relative 3.0 0.0 - 5.0 %   Basophils Relative 1.0 0.0 - 3.0 %   Neutro Abs 3.9 1.4 - 7.7 K/uL   Lymphs Abs 1.9 0.7 - 4.0 K/uL   Monocytes Absolute 0.7 0.1 - 1.0 K/uL   Eosinophils Absolute 0.2 0.0 - 0.7 K/uL   Basophils Absolute  0.1 0.0 - 0.1 K/uL  TSH  Result Value Ref Range   TSH 2.14 0.35 - 4.50 uIU/mL   No results found.   Medications  acetaminophen (TYLENOL) tablet 1,000 mg (1,000 mg Oral Given 03/18/16 0347)  naproxen (NAPROSYN) tablet 500 mg (500 mg Oral Given 03/18/16 0347)    Highly doubt DVT as pain is not on the same  side as vein, there is no calf swelling or tenderness on exam and  suspect charlie horse, there is a palpable spasm in the lateral gastrocnemius. Patient's exam otherwise unremarkable. As pt noted recent long car travel, she will be scheduled for outpt doppler imaging in the am at [facility]. Recommended massage and heat for conservative home management of symptoms. Return precautions discussed and outlined in discharge paperwork. Pt appears stable for discharge at this time. Pt is agreeable to plan.   I personally performed the services described in this documentation, which was scribed in my presence. The recorded information has been reviewed and is accurate.     Veatrice Kells, MD 03/18/16 [phone]

## 2016-03-18 NOTE — ED Notes (Signed)
Pt reports understanding of discharge information. No questions at time of discharge 

## 2016-03-18 NOTE — Discharge Instructions (Signed)

## 2016-03-18 NOTE — ED Notes (Signed)
Awaiting disposition by provider.

## 2016-03-18 NOTE — ED Notes (Signed)
Pt c/o left leg cramping starting approximately 45 minutes ago.  Walking helped w/ the pain.  States cramping is now intermittent.  Pt did have a prolonged car trip to [place] last week.  Pt also reports that when the leg cramps were constant she became flushed and felt her heart start to race, that has since resolved.  Of note pt had shingles on this leg last year.

## 2016-03-22 ENCOUNTER — Ambulatory Visit: Payer: BLUE CROSS/BLUE SHIELD | Admitting: Physical Therapy

## 2016-03-27 ENCOUNTER — Ambulatory Visit: Payer: BLUE CROSS/BLUE SHIELD

## 2016-03-29 ENCOUNTER — Ambulatory Visit: Payer: BLUE CROSS/BLUE SHIELD

## 2016-03-29 DIAGNOSIS — M25611 Stiffness of right shoulder, not elsewhere classified: Secondary | ICD-10-CM

## 2016-03-29 DIAGNOSIS — R293 Abnormal posture: Secondary | ICD-10-CM

## 2016-03-29 DIAGNOSIS — M6281 Muscle weakness (generalized): Secondary | ICD-10-CM

## 2016-03-29 DIAGNOSIS — M25511 Pain in right shoulder: Secondary | ICD-10-CM | POA: Diagnosis not present

## 2016-03-29 NOTE — Therapy (Addendum)
[facility] [address], [place], [place], [address] Phone: [phone]   Fax:  [phone]  Physical Therapy Treatment  Patient Details  Name: Melody Lewis MRN: [id number] Date of Birth: Mar 14, 1961 Referring Provider: Hulan Saas, MD  Encounter Date: 03/29/2016      PT End of Session - 03/29/16 1305    Visit Number 10   Date for PT Re-Evaluation 04/05/16   PT Start Time 9983   PT Stop Time 1316   PT Time Calculation (min) 41 min   Activity Tolerance Patient tolerated treatment well   Behavior During Therapy [facility] for tasks assessed/performed      Past Medical History  Diagnosis Date  . SINUSITIS- ACUTE-NOS 12/22/2009  . ALLERGIC RHINITIS 10/18/2008  . SHOULDER PAIN, LEFT 10/18/2010  . WRIST PAIN, RIGHT 10/18/2008  . KNEE PAIN, RIGHT 10/18/2008  . LOW BACK PAIN 10/18/2008  . LUMBAR RADICULOPATHY, RIGHT 06/28/2009  . BACK PAIN, UPPER 10/18/2008  . BACK PAIN, UPPER 10/18/2008  . UNSPECIFIED NEURALGIA NEURITIS AND RADICULITIS 10/18/2008  . INTERMITTENT VERTIGO 11/01/2009  . Dysuria 10/18/2010  . GERD (gastroesophageal reflux disease) 06/20/2011  . Seasonal allergies   . Allergy   . Blood transfusion without reported diagnosis     Past Surgical History  Procedure Laterality Date  . Tonsillectomy  1987  . Abdominal hysterectomy  05/2005    due to fibroids, still born, ovaries intact  . Colonoscopy  08-04-2009  . Cesarean section  1983  . Dilation and curettage of uterus  2006    There were no vitals filed for this visit.      Subjective Assessment - 03/29/16 1238    Subjective Missed last appointment due to being stuck at work.  Pt reports that her Rt shoulder is feeling better.     Patient Stated Goals reduce Rt shoulder pain, improve Rt shoulder AROM and use, sleep with fewer interruptions   Currently in Pain? Yes   Pain Score 2    Pain Location Shoulder   Pain Orientation Right   Pain Descriptors /  Indicators Pins and needles;Pressure   Pain Type Chronic pain   Pain Onset More than a month ago   Pain Frequency Intermittent   Aggravating Factors  reaching overhead, lifting, reaching out to the side   Pain Relieving Factors not performing aggravating motion            [facility] PT Assessment - 03/29/16 0001    Assessment   Medical Diagnosis Frozen shoulder syndrome, Rt    Onset Date/Surgical Date 06/11/15   Hand Dominance Right   Home Environment   Living Environment Private residence   Type of [place]   Prior Function   Level of Independence Independent   Vocation Full time employment   Vocation Requirements desk work    Leisure none   Cognition   Overall Cognitive Status Within Functional Limits for tasks assessed   Observation/Other Assessments   Focus on Therapeutic Outcomes (FOTO)  35% limitation   Posture/Postural Control   Posture/Postural Control Postural limitations   Postural Limitations Forward head;Rounded Shoulders   AROM   Right Shoulder Flexion 122 Degrees   Right Shoulder ABduction 115 Degrees   Right Shoulder Internal Rotation --  lacking 2.5 inches vs the Lt   Strength   Overall Strength Deficits   Strength Assessment Site Shoulder   Right/Left Shoulder Right   Right Shoulder Flexion 4+/5   Right Shoulder ABduction 4/5   Right Shoulder  Internal Rotation 5/5   Right Shoulder External Rotation 4+/5                     [facility] Adult PT Treatment/Exercise - 03/29/16 0001    Shoulder Exercises: Seated   Horizontal ABduction Strengthening;Both;20 reps;Theraband   Theraband Level (Shoulder Horizontal ABduction) Level 2 (Red)   Flexion Strengthening;Right;20 reps   Flexion Weight (lbs) 1   Abduction Strengthening;Right;20 reps;Weights   ABduction Weight (lbs) 1   ABduction Limitations scaption 1# 2x10   Shoulder Exercises: Standing   Flexion AAROM;10 reps;Right  finger ladder   Shoulder Flexion Weight (lbs) 1   ABduction  AAROM;Right;10 reps  finger ladder   Shoulder ABduction Weight (lbs) 1   Shoulder Exercises: Pulleys   Flexion 3 minutes   ABduction 3 minutes   Shoulder Exercises: ROM/Strengthening   UBE (Upper Arm Bike) level 1x 8 minutes (4/4)   Shoulder Exercises: Stretch   Other Shoulder Stretches Wall clock x 6 with Rt UE   Manual Therapy   Manual Therapy Joint mobilization;Passive ROM   Joint Mobilization PROM Rt shoulder , caudal & posterior glide                  PT Short Term Goals - 02/28/16 1240    PT SHORT TERM GOAL #3   Title report < or = to 4/10 Rt shoulder pain with use overhead   Status Achieved   PT SHORT TERM GOAL #4   Title report postural corrections with sitting at work to improve scapular strength   Status Achieved   PT SHORT TERM GOAL #5   Title demonstrate Rt shoulder AROM flexion to > or = to 130 degrees to improve overhead use   Time 4   Period Weeks   Status On-going           PT Long Term Goals - 03/29/16 1246    PT LONG TERM GOAL #1   Title be independent in advanced HEP   Time 8   Period Weeks   Status On-going   PT LONG TERM GOAL #2   Title reduce FOTO to < or = to 36% limitation   Status Achieved   PT LONG TERM GOAL #3   Title report < or = to 2/10 Rt shoulder pain with use   Status Achieved   PT LONG TERM GOAL #4   Title demonstrate Rt shoulder AROM IR to lacking < or = to 3 inches vs the Lt to improve use with self-care   Status Achieved   PT LONG TERM GOAL #5   Title demonstrate Rt shoulder AROM flexion to > or = to 145 degrees to improve overhead use   Period Weeks   Status On-going               Plan - 03/29/16 1247    Clinical Impression Statement Pt with lapse in treatment for ~1 week due to work schedule.  Pt 85% overall improvement in Rt shoulder symptoms since the start of care.  Pt with improved Rt shoulder strength and AROM today.  Pt still challenged with overhead flexion, abduction and IR due to pain and limited  AROM.  Pt will attend 1 more week for Rt shoulder AROM and strength advancement with probable D/C to HEP.   Rehab Potential Good   PT Frequency 2x / week   PT Duration 8 weeks   PT Treatment/Interventions ADLs/Self Care Home Management;Cryotherapy;Electrical Stimulation;Iontophoresis 80m/ml Dexamethasone;Moist Heat;Therapeutic exercise;Therapeutic activities;Ultrasound;Neuromuscular re-education;Patient/family  education;Manual techniques;Taping;Dry needling;Passive range of motion   PT Next Visit Plan Rt shoulder AROM, strength, AAROM and manual as needed.     Consulted and Agree with Plan of Care Patient      Patient will benefit from skilled therapeutic intervention in order to improve the following deficits and impairments:  Postural dysfunction, Decreased strength, Impaired flexibility, Pain, Decreased activity tolerance, Increased muscle spasms, Decreased range of motion  Visit Diagnosis: Abnormal posture - Plan: PT plan of care cert/re-cert  Stiffness of right shoulder, not elsewhere classified - Plan: PT plan of care cert/re-cert  Muscle weakness (generalized) - Plan: PT plan of care cert/re-cert     Problem List Patient Active Problem List   Diagnosis Date Noted  . Acute sinus infection 02/24/2016  . Radiculitis of left cervical region 02/24/2016  . Frozen shoulder syndrome 01/03/2016  . Supraclavicular lymphadenopathy 12/26/2015  . Nasal sore 12/26/2015  . Right shoulder pain 03/17/2015  . Cystitis 04/20/2014  . Urinary frequency 03/03/2013  . Stress incontinence in female 03/03/2013  . Vaginal dryness, menopausal 03/03/2013  . GERD (gastroesophageal reflux disease) 06/20/2011  . Preventative health care 05/04/2011  . LUMBAR RADICULOPATHY, RIGHT 06/28/2009  . Allergic rhinitis 10/18/2008  . LOW BACK PAIN 10/18/2008  . BACK PAIN, UPPER 10/18/2008    Sigurd Sos, PT 03/29/2016 1:12 PM PHYSICAL THERAPY DISCHARGE SUMMARY  Visits from Start of Care: 10  Current  functional level related to goals / functional outcomes: Pt attended 10 PT sessions and was making steady progress with AROM and strength progression.  Pt didn't come to her final PT sessions.  Plan was to D/C this week.     Remaining deficits: See above for most recent status.     Education / Equipment: HEP, posture education Plan: Patient agrees to discharge.  Patient goals were partially met. Patient is being discharged due to being pleased with the current functional level.  ?????   Sigurd Sos, PT 04/05/2016 2:13 PM  [facility] [address] [place], [place], [address] Phone: [phone]   Fax:  [phone]  Name: SUZZANNE BRUNKHORST MRN: [id number] Date of Birth: [birth year]

## 2016-04-03 ENCOUNTER — Ambulatory Visit: Payer: BLUE CROSS/BLUE SHIELD

## 2016-04-05 ENCOUNTER — Ambulatory Visit: Payer: BLUE CROSS/BLUE SHIELD

## 2016-05-17 ENCOUNTER — Other Ambulatory Visit: Payer: Self-pay | Admitting: Internal Medicine

## 2016-05-17 DIAGNOSIS — Z1231 Encounter for screening mammogram for malignant neoplasm of breast: Secondary | ICD-10-CM

## 2016-05-24 ENCOUNTER — Ambulatory Visit
Admission: RE | Admit: 2016-05-24 | Discharge: 2016-05-24 | Disposition: A | Discharge status: Home / Self Care | Payer: BLUE CROSS/BLUE SHIELD | Source: Ambulatory Visit | Attending: Internal Medicine | Admitting: Internal Medicine

## 2016-05-24 DIAGNOSIS — Z1231 Encounter for screening mammogram for malignant neoplasm of breast: Secondary | ICD-10-CM

## 2016-12-04 ENCOUNTER — Ambulatory Visit (INDEPENDENT_AMBULATORY_CARE_PROVIDER_SITE_OTHER): Payer: BLUE CROSS/BLUE SHIELD | Admitting: Family Medicine

## 2016-12-04 VITALS — BP 136/88 | HR 79 | Temp 97.6°F | Resp 16 | Ht 64.0 in | Wt 233.4 lb

## 2016-12-04 DIAGNOSIS — G8929 Other chronic pain: Secondary | ICD-10-CM | POA: Diagnosis not present

## 2016-12-04 DIAGNOSIS — M545 Low back pain, unspecified: Secondary | ICD-10-CM

## 2016-12-04 DIAGNOSIS — N3281 Overactive bladder: Secondary | ICD-10-CM | POA: Diagnosis not present

## 2016-12-04 DIAGNOSIS — R030 Elevated blood-pressure reading, without diagnosis of hypertension: Secondary | ICD-10-CM

## 2016-12-04 LAB — POCT URINALYSIS DIP (MANUAL ENTRY)
Bilirubin, UA: NEGATIVE
Glucose, UA: NEGATIVE
Ketones, POC UA: NEGATIVE
Leukocytes, UA: NEGATIVE
Nitrite, UA: NEGATIVE
Protein Ur, POC: NEGATIVE
Spec Grav, UA: 1.01
Urobilinogen, UA: 0.2
pH, UA: 5.5

## 2016-12-04 LAB — POC MICROSCOPIC URINALYSIS (UMFC): Mucus: ABSENT

## 2016-12-04 MED ORDER — TOLTERODINE TARTRATE ER 2 MG PO CP24: 2.0000 mg | ORAL_CAPSULE | Freq: Every day | ORAL | 1 refills | Status: DC

## 2016-12-04 MED ORDER — NAPROXEN 500 MG PO TABS: 500.0000 mg | ORAL_TABLET | Freq: Two times a day (BID) | ORAL | 0 refills | Status: DC | PRN

## 2016-12-04 NOTE — Progress Notes (Signed)
Patient ID: Melody Lewis, female    DOB: February 14, 1961, 55 y.o.   MRN: [id number]  PCP: Cathlean Cower, MD  Chief Complaint  Patient presents with  . Back Pain  . Urinary Frequency  . Dysuria    Subjective:   HPI 55 year old female, presents for evaluation of overactive bladder and low back pain.  Overactive Bladder Urgency intermittent and cramping . Waking up in the middle night to urinate. Denies burning with urination or changes in urine odor.  Low Back Pain Chronic intermittent problems of low back pain. Take Flexeril as needed for back pain, which doesn't completely resolve pain.   Elevated Blood Pressure Reports elevated typically upon arrival at medical appointments. Typically runs normal 120/80.  Review of Systems See HPI   Patient Active Problem List   Diagnosis Date Noted  . Acute sinus infection 02/24/2016  . Radiculitis of left cervical region 02/24/2016  . Frozen shoulder syndrome 01/03/2016  . Supraclavicular lymphadenopathy 12/26/2015  . Nasal sore 12/26/2015  . Right shoulder pain 03/17/2015  . Cystitis 04/20/2014  . Urinary frequency 03/03/2013  . Stress incontinence in female 03/03/2013  . Vaginal dryness, menopausal 03/03/2013  . GERD (gastroesophageal reflux disease) 06/20/2011  . Preventative health care 05/04/2011  . LUMBAR RADICULOPATHY, RIGHT 06/28/2009  . Allergic rhinitis 10/18/2008  . LOW BACK PAIN 10/18/2008  . BACK PAIN, UPPER 10/18/2008     Prior to Admission medications   Medication Sig Start Date End Date Taking? Authorizing Provider  cyclobenzaprine (FLEXERIL) 5 MG tablet Take 1 tablet (5 mg total) by mouth 3 (three) times daily as needed for muscle spasms. 01/25/15  Yes Biagio Borg, MD  levocetirizine (XYZAL) 5 MG tablet Take 1 tablet (5 mg total) by mouth every evening. As needed 01/13/16  Yes Biagio Borg, MD  mupirocin nasal ointment (BACTROBAN) 2 % Place 1 application into the nose as needed. Use one-half of tube in each  nostril twice daily for five (5) days. After application, press sides of nose together and gently massage.   Yes Historical Provider, MD  valACYclovir (VALTREX) 1000 MG tablet Take 2 tablets (2,000 mg total) by mouth 2 (two) times daily. For one day for outbreak of sores 02/14/16  Yes Binnie Rail, MD     Allergies  Allergen Reactions  . Sulfamethoxazole-Trimethoprim     REACTION: Faint       Objective:  Physical Exam  Constitutional: She is oriented to person, place, and time. She appears well-developed and well-nourished.  HENT:  Head: Normocephalic and atraumatic.  Eyes: Conjunctivae are normal. Pupils are equal, round, and reactive to light.  Neck: Normal range of motion. Neck supple.  Cardiovascular: Normal rate, regular rhythm, normal heart sounds and intact distal pulses.   Pulmonary/Chest: Effort normal and breath sounds normal.  Musculoskeletal: Normal range of motion.  Lymphadenopathy:    She has no cervical adenopathy.  Neurological: She is alert and oriented to person, place, and time.  Skin: Skin is warm and dry.  Psychiatric: She has a normal mood and affect. Her behavior is normal. Judgment and thought content normal.     Today's Vitals   12/04/16 1630 12/04/16 1635  BP: (!) 142/88 136/88  Pulse: 79   Resp: 16   Temp: 97.6 F (36.4 C)   TempSrc: Oral   SpO2: 99%   Weight: 233 lb 6.4 oz (105.9 kg)   Height: 5\' 4"  (1.626 m)    Assessment & Plan:  1. Chronic bilateral low  back pain without sciatica - POCT Microscopic Urinalysis ([facility]) - POCT urinalysis dipstick Plan: -Naproxen 500 mg up to twice daily as needed.  2. OAB (overactive bladder) - POCT Microscopic Urinalysis ([facility]) - POCT urinalysis dipstick Plan: -Detrol LA 2 mg once daily for overactive bladder.  3. Single episode of elevated blood pressure -Return in 6 weeks for recheck of blood pressure.   Carroll Sage. Kenton Kingfisher, MSN, FNP-C [facility]

## 2016-12-04 NOTE — Patient Instructions (Addendum)
Detrol LA 2 mg once daily for overactive bladder.  Naproxen 500 mg up to twice daily as needed.  Return in 6 weeks for recheck of blood pressure and overactive bladder.    IF you received an x-ray today, you will receive an invoice from [facility]. Please contact [facility] at [phone] with questions or concerns regarding your invoice.   IF you received labwork today, you will receive an invoice from [facility]. Please contact [facility] at [phone] with questions or concerns regarding your invoice.   Our billing staff will not be able to assist you with questions regarding bills from these companies.  You will be contacted with the lab results as soon as they are available. The fastest way to get your results is to activate your My Chart account. Instructions are located on the last page of this paperwork. If you have not heard from [place] regarding the results in 2 weeks, please contact this office.    ,  Sciatica  Introduction Sciatica is pain, numbness, weakness, or tingling along your sciatic nerve. The sciatic nerve starts in the lower back and goes down the back of each leg. Sciatica happens when this nerve is pinched or has pressure put on it. Sciatica usually goes away on its own or with treatment. Sometimes, sciatica may keep coming back (recur). Follow these instructions at home: Medicines  Take over-the-counter and prescription medicines only as told by your doctor.  Do not drive or use heavy machinery while taking prescription pain medicine. Managing pain  If directed, put ice on the affected area.  Put ice in a plastic bag.  Place a towel between your skin and the bag.  Leave the ice on for 20 minutes, 2-3 times a day.  After icing, apply heat to the affected area before you exercise or as often as told by your doctor. Use the heat source that your doctor tells you to use, such as a moist heat pack or a heating pad.  Place a towel between  your skin and the heat source.  Leave the heat on for 20-30 minutes.  Remove the heat if your skin turns bright red. This is especially important if you are unable to feel pain, heat, or cold. You may have a greater risk of getting burned. Activity  Return to your normal activities as told by your doctor. Ask your doctor what activities are safe for you.  Avoid activities that make your sciatica worse.  Take short rests during the day. Rest in a lying or standing position. This is usually better than sitting to rest.  When you rest for a long time, do some physical activity or stretching between periods of rest.  Avoid sitting for a long time without moving. Get up and move around at least one time each hour.  Exercise and stretch regularly, as told by your doctor.  Do not lift anything that is heavier than 10 lb (4.5 kg) while you have symptoms of sciatica.  Avoid lifting heavy things even when you do not have symptoms.  Avoid lifting heavy things over and over.  When you lift objects, always lift in a way that is safe for your body. To do this, you should:  Bend your knees.  Keep the object close to your body.  Avoid twisting. General instructions  Use good posture.  Avoid leaning forward when you are sitting.  Avoid hunching over when you are standing.  Stay at a healthy weight.  Wear comfortable shoes that  support your feet. Avoid wearing high heels.  Avoid sleeping on a mattress that is too soft or too hard. You might have less pain if you sleep on a mattress that is firm enough to support your back.  Keep all follow-up visits as told by your doctor. This is important. Contact a doctor if:  You have pain that:  Wakes you up when you are sleeping.  Gets worse when you lie down.  Is worse than the pain you have had in the past.  Lasts longer than 4 weeks.  You lose weight for without trying. Get help right away if:  You cannot control when you pee  (urinate) or poop (have a bowel movement).  You have weakness in any of these areas and it gets worse.  Lower back.  Lower belly (pelvis).  Butt (buttocks).  Legs.  You have redness or swelling of your back.  You have a burning feeling when you pee. This information is not intended to replace advice given to you by your health care provider. Make sure you discuss any questions you have with your health care provider. Document Released: 09/04/2008 Document Revised: 05/03/2016 Document Reviewed: 08/05/2015  2017 Elsevier      Overactive Bladder, Adult Introduction Overactive bladder is a group of urinary symptoms. With overactive bladder, you may suddenly feel the need to pass urine (urinate) right away. After feeling this sudden urge, you might also leak urine if you cannot get to the bathroom fast enough (urinary incontinence). These symptoms might interfere with your daily work or social activities. Overactive bladder symptoms may also wake you up at night. Overactive bladder affects the nerve signals between your bladder and your brain. Your bladder may get the signal to empty before it is full. Very sensitive muscles can also make your bladder squeeze too soon. What are the causes? Many things can cause an overactive bladder. Possible causes include:  Urinary tract infection.  Infection of nearby tissues, such as the prostate.  Prostate enlargement.  Being pregnant with twins or more (multiples).  Surgery on the uterus or urethra.  Bladder stones, inflammation, or tumors.  Drinking too much caffeine or alcohol.  Certain medicines, especially those that you take to help your body get rid of extra fluid (diuretics) by increasing urine production.  Muscle or nerve weakness, especially from:  A spinal cord injury.  Stroke.  Multiple sclerosis.  Parkinson disease.  Diabetes. This can cause a high urine volume that fills the bladder so quickly that the normal  urge to urinate is triggered very strongly.  Constipation. A buildup of too much stool can put pressure on your bladder. What increases the risk? You may be at greater risk for overactive bladder if you:  Are an older adult.  Smoke.  Are going through menopause.  Have prostate problems.  Have a neurological disease, such as stroke, dementia, Parkinson disease, or multiple sclerosis (MS).  Eat or drink things that irritate the bladder. These include alcohol, spicy food, and caffeine.  Are overweight or obese. What are the signs or symptoms? The signs and symptoms of an overactive bladder include:  Sudden, strong urges to urinate.  Leaking urine.  Urinating eight or more times per day.  Waking up to urinate two or more times per night. How is this diagnosed? Your health care provider may suspect overactive bladder based on your symptoms. The health care provider will do a physical exam and take your medical history. Blood or urine tests may also  be done. For example, you might need to have a bladder function test to check how well you can hold your urine. You might also need to see a health care provider who specializes in the urinary tract (urologist). How is this treated? Treatment for overactive bladder depends on the cause of your condition and whether it is mild or severe. Certain treatments can be done in your health care provider's office or clinic. You can also make lifestyle changes at home. Options include: Behavioral Treatments  Biofeedback. A specialist uses sensors to help you become aware of your body's signals.  Keeping a daily log of when you need to urinate and what happens after the urge. This may help you manage your condition.  Bladder training. This helps you learn to control the urge to urinate by following a schedule that directs you to urinate at regular intervals (timed voiding). At first, you might have to wait a few minutes after feeling the urge. In  time, you should be able to schedule bathroom visits an hour or more apart.  Kegel exercises. These are exercises to strengthen the pelvic floor muscles, which support the bladder. Toning these muscles can help you control urination, even if your bladder muscles are overactive. A specialist will teach you how to do these exercises correctly. They require daily practice.  Weight loss. If you are obese or overweight, losing weight might relieve your symptoms of overactive bladder. Talk to your health care provider about losing weight and whether there is a specific program or method that would work best for you.  Diet change. This might help if constipation is making your overactive bladder worse. Your health care provider or a dietitian can explain ways to change what you eat to ease constipation. You might also need to consume less alcohol and caffeine or drink other fluids at different times of the day.  Stopping smoking.  Wearing pads to absorb leakage while you wait for other treatments to take effect. Physical Treatments  Electrical stimulation. Electrodes send gentle pulses of electricity to strengthen the nerves or muscles that help to control the bladder. Sometimes, the electrodes are placed outside of the body. In other cases, they might be placed inside the body (implanted). This treatment can take several months to have an effect.  Supportive devices. Women may need a plastic device that fits into the vagina and supports the bladder (pessary). Medicines  Several medicines can help treat overactive bladder and are usually used along with other treatments. Some are injected into the muscles involved in urination. Others come in pill form. Your health care provider may prescribe:  Antispasmodics. These medicines block the signals that the nerves send to the bladder. This keeps the bladder from releasing urine at the wrong time.  Tricyclic antidepressants. These types of antidepressants  also relax bladder muscles. Surgery  You may have a device implanted to help manage the nerve signals that indicate when you need to urinate.  You may have surgery to implant electrodes for electrical stimulation.  Sometimes, very severe cases of overactive bladder require surgery to change the shape of the bladder. Follow these instructions at home:  Take medicines only as directed by your health care provider.  Use any implants or a pessary as directed by your health care provider.  Make any diet or lifestyle changes that are recommended by your health care provider. These might include:  Drinking less fluid or drinking at different times of the day. If you need to urinate  often during the night, you may need to stop drinking fluids early in the evening.  Cutting down on caffeine or alcohol. Both can make an overactive bladder worse. Caffeine is found in coffee, tea, and sodas.  Doing Kegel exercises to strengthen muscles.  Losing weight if you need to.  Eating a healthy and balanced diet to prevent constipation.  Keep a journal or log to track how much and when you drink and also when you feel the need to urinate. This will help your health care provider to monitor your condition. Contact a health care provider if:  Your symptoms do not get better after treatment.  Your pain and discomfort are getting worse.  You have more frequent urges to urinate.  You have a fever. Get help right away if: You are not able to control your bladder at all. This information is not intended to replace advice given to you by your health care provider. Make sure you discuss any questions you have with your health care provider. Document Released: 09/22/2009 Document Revised: 05/03/2016 Document Reviewed: 04/21/2014  2017 Elsevier

## 2016-12-05 ENCOUNTER — Ambulatory Visit: Payer: BLUE CROSS/BLUE SHIELD | Admitting: Internal Medicine

## 2016-12-06 ENCOUNTER — Encounter: Payer: Self-pay | Admitting: Internal Medicine

## 2017-01-14 ENCOUNTER — Other Ambulatory Visit: Payer: Self-pay | Admitting: Internal Medicine

## 2017-07-04 ENCOUNTER — Ambulatory Visit (INDEPENDENT_AMBULATORY_CARE_PROVIDER_SITE_OTHER): Payer: BLUE CROSS/BLUE SHIELD | Admitting: Internal Medicine

## 2017-07-04 ENCOUNTER — Encounter: Payer: Self-pay | Admitting: Internal Medicine

## 2017-07-04 VITALS — BP 128/84 | Ht 64.0 in | Wt 234.0 lb

## 2017-07-04 DIAGNOSIS — K219 Gastro-esophageal reflux disease without esophagitis: Secondary | ICD-10-CM | POA: Diagnosis not present

## 2017-07-04 DIAGNOSIS — J309 Allergic rhinitis, unspecified: Secondary | ICD-10-CM

## 2017-07-04 DIAGNOSIS — J019 Acute sinusitis, unspecified: Secondary | ICD-10-CM

## 2017-07-04 MED ORDER — TRIAMCINOLONE ACETONIDE 55 MCG/ACT NA AERO: 2.0000 | INHALATION_SPRAY | Freq: Every day | NASAL | 12 refills | Status: DC

## 2017-07-04 MED ORDER — MUPIROCIN CALCIUM 2 % NA OINT: 1.0000 "application " | TOPICAL_OINTMENT | NASAL | 3 refills | Status: DC | PRN

## 2017-07-04 MED ORDER — CYCLOBENZAPRINE HCL 5 MG PO TABS: 5.0000 mg | ORAL_TABLET | Freq: Three times a day (TID) | ORAL | 2 refills | Status: DC | PRN

## 2017-07-04 MED ORDER — LEVOCETIRIZINE DIHYDROCHLORIDE 5 MG PO TABS: ORAL_TABLET | ORAL | 3 refills | Status: DC

## 2017-07-04 MED ORDER — LEVOFLOXACIN 500 MG PO TABS: 500.0000 mg | ORAL_TABLET | Freq: Every day | ORAL | 0 refills | Status: AC

## 2017-07-04 MED ORDER — IBUPROFEN 800 MG PO TABS: 800.0000 mg | ORAL_TABLET | Freq: Three times a day (TID) | ORAL | 0 refills | Status: DC | PRN

## 2017-07-04 NOTE — Patient Instructions (Addendum)
Please take all new medication as prescribed - the antibiotic, ibuprofen and nasacort  Please continue all other medications as before, including the nasal ointment, muscle relaxer, and allergy medication (generic xyzal)  Please call for ENT referral if not better in 3-5 days  Please have the pharmacy call with any other refills you may need.  Please continue your efforts at being more active, low cholesterol diet, and weight control.  You are otherwise up to date with prevention measures today.  Please keep your appointments with your specialists as you may have planned

## 2017-07-05 ENCOUNTER — Encounter: Payer: Self-pay | Admitting: Internal Medicine

## 2017-07-05 MED ORDER — AMOXICILLIN-POT CLAVULANATE 875-125 MG PO TABS: 1.0000 | ORAL_TABLET | Freq: Two times a day (BID) | ORAL | 0 refills | Status: DC

## 2017-07-05 NOTE — Telephone Encounter (Signed)
Pt called regarding this, she states she does not want to take this Med because she already has a lot of those issues and does not want to add to it.  She is aware Jenny Reichmann and Shirron are off today but she states she has an infection and needs to start an antibiotic.  Please advise

## 2017-07-05 NOTE — Telephone Encounter (Signed)
Will forward to one of the covering provider for response...Melody Lewis

## 2017-07-07 NOTE — Assessment & Plan Note (Addendum)
stable overall by history and exam, doubt related to cough, ok for OTC PPI prn, and pt to continue medical treatment as before,  to f/u any worsening symptoms or concerns

## 2017-07-07 NOTE — Assessment & Plan Note (Signed)
Mild to mod, for antibx course,  to f/u any worsening symptoms or concerns 

## 2017-07-07 NOTE — Assessment & Plan Note (Signed)
Mild to mod, for restart xyzal, nasacort asd,  to f/u any worsening symptoms or concerns

## 2017-07-07 NOTE — Progress Notes (Signed)
Subjective:    Patient ID: Melody Lewis, female    DOB: 07/20/1961, 56 y.o.   MRN: [id number]  HPI   Here with 2-3 days acute onset fever, only left facial pain, pressure, headache, general weakness and malaise, and greenish d/c, with mild ST and worsening cough, but pt denies chest pain, wheezing, increased sob or doe, orthopnea, PND, increased LE swelling, palpitations, dizziness or syncope. Denies worsening reflux, abd pain, dysphagia, n/v, bowel change or blood. Does have several wks ongoing nasal allergy symptoms with clearish congestion, itch and sneezing, without fever, pain, ST, cough, swelling or wheezing Past Medical History:  Diagnosis Date  . ALLERGIC RHINITIS 10/18/2008  . Allergy   . BACK PAIN, UPPER 10/18/2008  . BACK PAIN, UPPER 10/18/2008  . Blood transfusion without reported diagnosis   . Dysuria 10/18/2010  . GERD (gastroesophageal reflux disease) 06/20/2011  . INTERMITTENT VERTIGO 11/01/2009  . KNEE PAIN, RIGHT 10/18/2008  . LOW BACK PAIN 10/18/2008  . LUMBAR RADICULOPATHY, RIGHT 06/28/2009  . Seasonal allergies   . SHOULDER PAIN, LEFT 10/18/2010  . SINUSITIS- ACUTE-NOS 12/22/2009  . UNSPECIFIED NEURALGIA NEURITIS AND RADICULITIS 10/18/2008  . WRIST PAIN, RIGHT 10/18/2008   Past Surgical History:  Procedure Laterality Date  . ABDOMINAL HYSTERECTOMY  05/2005   due to fibroids, still born, ovaries intact  . CESAREAN SECTION  1983  . COLONOSCOPY  08-04-2009  . DILATION AND CURETTAGE OF UTERUS  2006  . TONSILLECTOMY  1987    reports that she has never smoked. She has never used smokeless tobacco. She reports that she does not drink alcohol or use drugs. family history includes Arthritis in her father and mother; Colon cancer in her father and maternal aunt; Dementia in her father; Diabetes in her father; Hypertension in her brother, daughter, and mother; Kidney disease in her mother; Parkinsonism in her father. Allergies  Allergen Reactions  . Sulfamethoxazole-Trimethoprim      REACTION: Faint   Current Outpatient Prescriptions on File Prior to Visit  Medication Sig Dispense Refill  . naproxen (NAPROSYN) 500 MG tablet Take 1 tablet (500 mg total) by mouth 2 (two) times daily as needed (with food). 30 tablet 0  . tolterodine (DETROL LA) 2 MG 24 hr capsule Take 1 capsule (2 mg total) by mouth daily. 30 capsule 1  . valACYclovir (VALTREX) 1000 MG tablet Take 2 tablets (2,000 mg total) by mouth 2 (two) times daily. For one day for outbreak of sores 12 tablet 5  . [DISCONTINUED] mometasone (NASONEX) 50 MCG/ACT nasal spray 2 sprays by Nasal route daily. 17 g 2   No current facility-administered medications on file prior to visit.    Review of Systems  Constitutional: Negative for other unusual diaphoresis or sweats HENT: Negative for ear discharge or swelling Eyes: Negative for other worsening visual disturbances Respiratory: Negative for stridor or other swelling  Gastrointestinal: Negative for worsening distension or other blood Genitourinary: Negative for retention or other urinary change Musculoskeletal: Negative for other MSK pain or swelling Skin: Negative for color change or other new lesions Neurological: Negative for worsening tremors and other numbness  Psychiatric/Behavioral: Negative for worsening agitation or other fatigue All other system neg per pt    Objective:   Physical Exam BP 128/84   Ht 5\' 4"  (1.626 m)   Wt 234 lb (106.1 kg)   BMI 40.17 kg/m  VS noted, mild ill Constitutional: Pt appears in NAD HENT: Head: NCAT.  Right Ear: External ear normal.  Left Ear: External ear  normal.  Eyes: . Pupils are equal, round, and reactive to light. Conjunctivae and EOM are normal Bilat tm's with mild erythema.  Max sinus areas severe left maxillary tender.  Pharynx with mild erythema, no exudate Nose: without d/c or deformity Neck: Neck supple. Gross normal ROM Cardiovascular: Normal rate and regular rhythm.   Pulmonary/Chest: Effort normal and  breath sounds without rales or wheezing.  Abd: soft NT + BS Neurological: Pt is alert. At baseline orientation, motor grossly intact Skin: Skin is warm. No rashes, other new lesions, no LE edema Psychiatric: Pt behavior is normal without agitation  No other exam findings    Assessment & Plan:

## 2018-02-26 ENCOUNTER — Encounter: Payer: Self-pay | Admitting: Emergency Medicine

## 2018-02-26 ENCOUNTER — Ambulatory Visit: Payer: BLUE CROSS/BLUE SHIELD | Admitting: Family Medicine

## 2018-02-26 ENCOUNTER — Encounter: Payer: Self-pay | Admitting: Family Medicine

## 2018-02-26 VITALS — BP 120/80 | HR 98 | Temp 99.7°F | Wt 236.6 lb

## 2018-02-26 DIAGNOSIS — B349 Viral infection, unspecified: Secondary | ICD-10-CM | POA: Diagnosis not present

## 2018-02-26 MED ORDER — HYDROCODONE-HOMATROPINE 5-1.5 MG/5ML PO SYRP: 5.0000 mL | ORAL_SOLUTION | Freq: Three times a day (TID) | ORAL | 0 refills | Status: DC | PRN

## 2018-02-26 NOTE — Patient Instructions (Addendum)

## 2018-02-26 NOTE — Progress Notes (Signed)
Subjective:  Patient ID: Melody Lewis, female    DOB: [birth year]  Age: 57 y.o. MRN: [id number]  CC: Acute Visit   HPI Melody Lewis presents for evaluation of a 3-day history of a URI illness.  She tells of elevated temperature, headache, nasal congestion, ear congestion and cough.  She does feel little wheezy.  She has no history of asthma.  She denies myalgias or arthralgias.  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  She has been taking day and nighttime TheraFlu with some relief.  She does not smoke or use illicit drugs.  Outpatient Medications Prior to Visit  Medication Sig Dispense Refill  . cyclobenzaprine (FLEXERIL) 5 MG tablet Take 1 tablet (5 mg total) by mouth 3 (three) times daily as needed for muscle spasms. 60 tablet 2  . ibuprofen (ADVIL,MOTRIN) 800 MG tablet Take 1 tablet (800 mg total) by mouth every 8 (eight) hours as needed. 60 tablet 0  . levocetirizine (XYZAL) 5 MG tablet TAKE ONE TABLET BY MOUTH IN THE EVENING AS NEEDED 90 tablet 3  . mupirocin nasal ointment (BACTROBAN) 2 % Place 1 application into the nose as needed. Use one-half of tube in each nostril twice daily for five (5) days. After application, press sides of nose together and gently massage. 10 g 3  . naproxen (NAPROSYN) 500 MG tablet Take 1 tablet (500 mg total) by mouth 2 (two) times daily as needed (with food). 30 tablet 0  . tolterodine (DETROL LA) 2 MG 24 hr capsule Take 1 capsule (2 mg total) by mouth daily. 30 capsule 1  . triamcinolone (NASACORT) 55 MCG/ACT AERO nasal inhaler Place 2 sprays into the nose daily. 1 Inhaler 12  . valACYclovir (VALTREX) 1000 MG tablet Take 2 tablets (2,000 mg total) by mouth 2 (two) times daily. For one day for outbreak of sores 12 tablet 5  . amoxicillin-clavulanate (AUGMENTIN) 875-125 MG tablet Take 1 tablet by mouth 2 (two) times daily. 14 tablet 0   No facility-administered medications prior to visit.      ROS Review of Systems  Constitutional: Positive for fatigue. Negative for chills, fever and unexpected weight change.  HENT: Positive for congestion, hearing loss, postnasal drip, sinus pressure and sinus pain. Negative for sore throat, trouble swallowing and voice change.   Eyes: Negative for photophobia and visual disturbance.  Respiratory: Positive for choking and wheezing. Negative for chest tightness and shortness of breath.   Cardiovascular: Negative.   Gastrointestinal: Negative.   Genitourinary: Negative.   Musculoskeletal: Negative for arthralgias, myalgias, neck pain and neck stiffness.  Skin: Negative for pallor and rash.  Allergic/Immunologic: Negative for immunocompromised state.  Neurological: Positive for headaches. Negative for weakness.  Hematological: Does not bruise/bleed easily.  Psychiatric/Behavioral: Negative.     Objective:  BP 120/80 (BP Location: Left Arm, Patient Position: Sitting, Cuff Size: Large)   Pulse 98   Temp 99.7 F (37.6 C) (Oral)   Wt 236 lb 9.6 oz (107.3 kg)   SpO2 97%   BMI 40.61 kg/m   BP Readings from Last 3 Encounters:  02/26/18 120/80  07/04/17 128/84  12/04/16 136/88    Wt Readings from Last 3 Encounters:  02/26/18 236 lb 9.6 oz (107.3 kg)  07/04/17 234 lb (106.1 kg)  12/04/16 233 lb 6.4 oz (105.9 kg)    Physical Exam  Constitutional: She is oriented to person, place, and time. She appears well-developed and well-nourished. No  distress.  HENT:  Head: Normocephalic and atraumatic.  Right Ear: Tympanic membrane, external ear and ear canal normal.  Left Ear: Tympanic membrane, external ear and ear canal normal.  Mouth/Throat: Oropharynx is clear and moist. No oropharyngeal exudate.  Eyes: Conjunctivae are normal. Pupils are equal, round, and reactive to light. Right eye exhibits no discharge. Left eye exhibits no discharge. No scleral icterus.  Neck: Neck supple. No JVD present. No tracheal deviation present. No  thyromegaly present.  Cardiovascular: Normal rate, regular rhythm and normal heart sounds.  Pulmonary/Chest: Effort normal and breath sounds normal. No stridor. She has no decreased breath sounds. She has no wheezes. She has no rhonchi. She has no rales.  Abdominal: Soft. Bowel sounds are normal.  Lymphadenopathy:    She has no cervical adenopathy.  Neurological: She is oriented to person, place, and time.  Skin: Skin is warm and dry. She is not diaphoretic.  Psychiatric: She has a normal mood and affect. Her behavior is normal.    Lab Results  Component Value Date   WBC 6.8 12/26/2015   HGB 12.5 12/26/2015   HCT 38.0 12/26/2015   PLT 261.0 12/26/2015   GLUCOSE 86 12/26/2015   CHOL 164 12/26/2015   TRIG 72.0 12/26/2015   HDL 54.70 12/26/2015   LDLCALC 95 12/26/2015   ALT 15 12/26/2015   AST 15 12/26/2015   NA 143 12/26/2015   K 4.2 12/26/2015   CL 105 12/26/2015   CREATININE 0.79 12/26/2015   BUN 15 12/26/2015   CO2 28 12/26/2015   TSH 2.14 12/26/2015   INR 0.94 10/30/2009    Mm Screening Breast Tomo Bilateral  Result Date: 05/24/2016 CLINICAL DATA:  Screening. EXAM: 2D DIGITAL SCREENING BILATERAL MAMMOGRAM WITH CAD AND ADJUNCT TOMO COMPARISON:  Previous exam(s). ACR Breast Density Category b: There are scattered areas of fibroglandular density. FINDINGS: There are no findings suspicious for malignancy. Images were processed with CAD. IMPRESSION: No mammographic evidence of malignancy. A result letter of this screening mammogram will be mailed directly to the patient. RECOMMENDATION: Screening mammogram in one year. (Code:SM-B-01Y) BI-RADS CATEGORY  1: Negative. Electronically Signed   By: Claudie Revering M.D.   On: 05/25/2016 07:42    Assessment & Plan:   Melody Lewis was seen today for acute visit.  Diagnoses and all orders for this visit:  Acute viral syndrome -     HYDROcodone-homatropine (HYCODAN) 5-1.5 MG/5ML syrup; Take 5 mLs by mouth every 8 (eight) hours as needed for  cough.   I have discontinued Mechele Claude I. Mcewen's amoxicillin-clavulanate. I am also having her start on HYDROcodone-homatropine. Additionally, I am having her maintain her valACYclovir, tolterodine, naproxen, cyclobenzaprine, mupirocin nasal ointment, levocetirizine, triamcinolone, and ibuprofen.  Meds ordered this encounter  Medications  . HYDROcodone-homatropine (HYCODAN) 5-1.5 MG/5ML syrup    Sig: Take 5 mLs by mouth every 8 (eight) hours as needed for cough.    Dispense:  120 mL    Refill:  0   No wheezing or rales on exam.  We will set the patient is a week where she will rest.  She can continue her daytime therapy with use cough syrup at mostly at night.  Follow-up in 1 week if not improving.  Follow-up: Return in about 1 week (around 03/05/2018), or if symptoms worsen or fail to improve.  Libby Maw, MD

## 2018-03-17 ENCOUNTER — Ambulatory Visit: Payer: BLUE CROSS/BLUE SHIELD | Admitting: Family

## 2018-03-17 ENCOUNTER — Encounter: Payer: Self-pay | Admitting: Family

## 2018-03-17 VITALS — BP 122/88 | HR 89 | Temp 98.5°F | Ht 64.0 in | Wt 235.1 lb

## 2018-03-17 DIAGNOSIS — N390 Urinary tract infection, site not specified: Secondary | ICD-10-CM | POA: Diagnosis not present

## 2018-03-17 LAB — POC URINALSYSI DIPSTICK (AUTOMATED)
Bilirubin, UA: NEGATIVE
Blood, UA: POSITIVE
Glucose, UA: NEGATIVE
Ketones, UA: NEGATIVE
Leukocytes, UA: NEGATIVE
Nitrite, UA: NEGATIVE
Protein, UA: NEGATIVE
Spec Grav, UA: >=1.03 — AB (ref 1.010–1.025)
Urobilinogen, UA: 0.2 E.U./dL
pH, UA: 6 (ref 5.0–8.0)

## 2018-03-17 MED ORDER — NITROFURANTOIN MONOHYD MACRO 100 MG PO CAPS: 100.0000 mg | ORAL_CAPSULE | Freq: Two times a day (BID) | ORAL | 0 refills | Status: DC

## 2018-03-17 NOTE — Progress Notes (Signed)
Patient Active Problem List   Diagnosis Date Noted  . Acute viral syndrome 02/26/2018  . Acute sinus infection 02/24/2016  . Radiculitis of left cervical region 02/24/2016  . Frozen shoulder syndrome 01/03/2016  . Supraclavicular lymphadenopathy 12/26/2015  . Nasal sore 12/26/2015  . Right shoulder pain 03/17/2015  . Cystitis 04/20/2014  . Urinary frequency 03/03/2013  . Stress incontinence in female 03/03/2013  . Vaginal dryness, menopausal 03/03/2013  . GERD (gastroesophageal reflux disease) 06/20/2011  . Preventative health care 05/04/2011  . LUMBAR RADICULOPATHY, RIGHT 06/28/2009  . Allergic rhinitis 10/18/2008  . LOW BACK PAIN 10/18/2008  . BACK PAIN, UPPER 10/18/2008    Current Outpatient Medications on File Prior to Visit  Medication Sig Dispense Refill  . cyclobenzaprine (FLEXERIL) 5 MG tablet Take 1 tablet (5 mg total) by mouth 3 (three) times daily as needed for muscle spasms. 60 tablet 2  . ibuprofen (ADVIL,MOTRIN) 800 MG tablet Take 1 tablet (800 mg total) by mouth every 8 (eight) hours as needed. 60 tablet 0  . levocetirizine (XYZAL) 5 MG tablet TAKE ONE TABLET BY MOUTH IN THE EVENING AS NEEDED 90 tablet 3  . mupirocin nasal ointment (BACTROBAN) 2 % Place 1 application into the nose as needed. Use one-half of tube in each nostril twice daily for five (5) days. After application, press sides of nose together and gently massage. 10 g 3  . valACYclovir (VALTREX) 1000 MG tablet Take 2 tablets (2,000 mg total) by mouth 2 (two) times daily. For one day for outbreak of sores 12 tablet 5  . [DISCONTINUED] mometasone (NASONEX) 50 MCG/ACT nasal spray 2 sprays by Nasal route daily. 17 g 2   No current facility-administered medications on file prior to visit.     @ALLERGYCOLLAPSE @  SUBJECTIVE:  Urinary Symptoms:  Patient complains of dysuria and frequency  Duration: 3 days.  Severity: Mild  Associated signs: back pain.  Patient denies fever.  Patient does have a history of  recurrent UTI.  Patient does not have a history of pyelonephritis.  Review of Systems.  Denies fever, chills, nausea, flank pain  OBJECTIVE:  Vitals:   03/17/18 1015  BP: 122/88  Pulse: 89  Temp: 98.5 F (36.9 C)  TempSrc: Oral  SpO2: 97%  Weight: 235 lb 1.9 oz (106.6 kg)  Height: 5\' 4"  (1.626 m)    General: Well developed, well nourished, in no acute distress  Abdomen: Soft; nontender; nondistended  Back: No CVA tenderness  Urine dipstick shows positive for red blood cells.  ASSESSMENT:  1. Urinary tract infection without hematuria, site unspecified     Check U/A today; not enough urine to culture; Rx for Macrobid 100 mg bid x 7 days; increase fluids, rest and follow up worse, no better.   Requested Prescriptions   Signed Prescriptions Disp Refills  . nitrofurantoin, macrocrystal-monohydrate, (MACROBID) 100 MG capsule 14 capsule 0    Sig: Take 1 capsule (100 mg total) by mouth 2 (two) times daily.    No orders of the defined types were placed in this encounter.   Current Outpatient Medications  Medication Sig Dispense Refill  . cyclobenzaprine (FLEXERIL) 5 MG tablet Take 1 tablet (5 mg total) by mouth 3 (three) times daily as needed for muscle spasms. 60 tablet 2  . ibuprofen (ADVIL,MOTRIN) 800 MG tablet Take 1 tablet (800 mg total) by mouth every 8 (eight) hours as needed. 60 tablet 0  . levocetirizine (XYZAL) 5 MG tablet TAKE ONE TABLET BY MOUTH IN THE EVENING AS NEEDED  90 tablet 3  . mupirocin nasal ointment (BACTROBAN) 2 % Place 1 application into the nose as needed. Use one-half of tube in each nostril twice daily for five (5) days. After application, press sides of nose together and gently massage. 10 g 3  . valACYclovir (VALTREX) 1000 MG tablet Take 2 tablets (2,000 mg total) by mouth 2 (two) times daily. For one day for outbreak of sores 12 tablet 5  . nitrofurantoin, macrocrystal-monohydrate, (MACROBID) 100 MG capsule Take 1 capsule (100 mg total) by mouth 2 (two)  times daily. 14 capsule 0   No current facility-administered medications for this visit.

## 2018-03-17 NOTE — Addendum Note (Signed)
Addended by: Marcina Millard on: 03/17/2018 10:57 AM   Modules accepted: Orders

## 2018-03-25 ENCOUNTER — Encounter: Payer: Self-pay | Admitting: Internal Medicine

## 2018-03-25 ENCOUNTER — Ambulatory Visit: Payer: BLUE CROSS/BLUE SHIELD | Admitting: Internal Medicine

## 2018-03-25 VITALS — BP 124/88 | HR 76 | Temp 97.8°F | Resp 16 | Wt 235.0 lb

## 2018-03-25 DIAGNOSIS — L089 Local infection of the skin and subcutaneous tissue, unspecified: Secondary | ICD-10-CM | POA: Insufficient documentation

## 2018-03-25 DIAGNOSIS — S80862A Insect bite (nonvenomous), left lower leg, initial encounter: Secondary | ICD-10-CM | POA: Insufficient documentation

## 2018-03-25 DIAGNOSIS — W57XXXA Bitten or stung by nonvenomous insect and other nonvenomous arthropods, initial encounter: Secondary | ICD-10-CM | POA: Diagnosis not present

## 2018-03-25 DIAGNOSIS — S70362A Insect bite (nonvenomous), left thigh, initial encounter: Secondary | ICD-10-CM | POA: Diagnosis not present

## 2018-03-25 DIAGNOSIS — Z23 Encounter for immunization: Secondary | ICD-10-CM

## 2018-03-25 MED ORDER — TRIAMCINOLONE ACETONIDE 0.5 % EX CREA: 1.0000 "application " | TOPICAL_CREAM | Freq: Three times a day (TID) | CUTANEOUS | 1 refills | Status: DC

## 2018-03-25 MED ORDER — DOXYCYCLINE HYCLATE 100 MG PO TABS: 100.0000 mg | ORAL_TABLET | Freq: Two times a day (BID) | ORAL | 0 refills | Status: AC

## 2018-03-25 NOTE — Patient Instructions (Signed)

## 2018-03-26 ENCOUNTER — Encounter: Payer: Self-pay | Admitting: Internal Medicine

## 2018-03-26 NOTE — Progress Notes (Signed)
Subjective:  Patient ID: Melody Lewis, female    DOB: 21-Jan-1961  Age: 57 y.o. MRN: [id number]  CC: Tick Removal   HPI Melody Lewis presents for concerns about a tick behind her left knee.  She felt an area of itching earlier today and someone told her there was a tick there.  She does not know how long it has been there.  She denies headache, rash, fever, chills, nausea, or vomiting.  Outpatient Medications Prior to Visit  Medication Sig Dispense Refill  . cyclobenzaprine (FLEXERIL) 5 MG tablet Take 1 tablet (5 mg total) by mouth 3 (three) times daily as needed for muscle spasms. 60 tablet 2  . ibuprofen (ADVIL,MOTRIN) 800 MG tablet Take 1 tablet (800 mg total) by mouth every 8 (eight) hours as needed. 60 tablet 0  . levocetirizine (XYZAL) 5 MG tablet TAKE ONE TABLET BY MOUTH IN THE EVENING AS NEEDED 90 tablet 3  . mupirocin nasal ointment (BACTROBAN) 2 % Place 1 application into the nose as needed. Use one-half of tube in each nostril twice daily for five (5) days. After application, press sides of nose together and gently massage. 10 g 3  . nitrofurantoin, macrocrystal-monohydrate, (MACROBID) 100 MG capsule Take 1 capsule (100 mg total) by mouth 2 (two) times daily. 14 capsule 0  . valACYclovir (VALTREX) 1000 MG tablet Take 2 tablets (2,000 mg total) by mouth 2 (two) times daily. For one day for outbreak of sores 12 tablet 5   No facility-administered medications prior to visit.     ROS Review of Systems  Constitutional: Negative.  Negative for chills, fatigue and fever.  HENT: Negative.  Negative for sore throat.   Eyes: Negative for visual disturbance.  Respiratory: Negative for cough, chest tightness, shortness of breath and wheezing.   Cardiovascular: Negative for chest pain, palpitations and leg swelling.  Gastrointestinal: Negative for abdominal pain, diarrhea and nausea.  Endocrine: Negative.   Genitourinary: Negative.  Negative for difficulty urinating.    Musculoskeletal: Negative.  Negative for myalgias and neck pain.  Skin: Positive for color change and wound. Negative for rash.  Allergic/Immunologic: Negative.   Neurological: Negative.   Hematological: Negative for adenopathy. Does not bruise/bleed easily.  Psychiatric/Behavioral: Negative.     Objective:  BP 124/88   Pulse 76   Temp 97.8 F (36.6 C)   Resp 16   Wt 235 lb (106.6 kg)   SpO2 98%   BMI 40.34 kg/m   BP Readings from Last 3 Encounters:  03/26/18 124/88  03/17/18 122/88  02/26/18 120/80    Wt Readings from Last 3 Encounters:  03/26/18 235 lb (106.6 kg)  03/17/18 235 lb 1.9 oz (106.6 kg)  02/26/18 236 lb 9.6 oz (107.3 kg)    Physical Exam  Constitutional: She is oriented to person, place, and time. No distress.  HENT:  Mouth/Throat: Oropharynx is clear and moist. No oropharyngeal exudate.  Eyes: Conjunctivae are normal. No scleral icterus.  Neck: Normal range of motion. Neck supple. No thyromegaly present.  Cardiovascular: Normal rate, regular rhythm and normal heart sounds. Exam reveals no gallop and no friction rub.  No murmur heard. Pulmonary/Chest: Effort normal and breath sounds normal. No stridor. No respiratory distress. She has no wheezes. She has no rales.  Abdominal: Soft. Bowel sounds are normal. She exhibits no distension and no mass. There is no tenderness.  Musculoskeletal: Normal range of motion. She exhibits no edema, tenderness or deformity.       Legs: Lymphadenopathy:  She has no cervical adenopathy.  Neurological: She is alert and oriented to person, place, and time.  Skin: Skin is warm and dry. No rash noted. She is not diaphoretic. There is erythema. No pallor.  Vitals reviewed.   Lab Results  Component Value Date   WBC 6.8 12/26/2015   HGB 12.5 12/26/2015   HCT 38.0 12/26/2015   PLT 261.0 12/26/2015   GLUCOSE 86 12/26/2015   CHOL 164 12/26/2015   TRIG 72.0 12/26/2015   HDL 54.70 12/26/2015   LDLCALC 95 12/26/2015    ALT 15 12/26/2015   AST 15 12/26/2015   NA 143 12/26/2015   K 4.2 12/26/2015   CL 105 12/26/2015   CREATININE 0.79 12/26/2015   BUN 15 12/26/2015   CO2 28 12/26/2015   TSH 2.14 12/26/2015   INR 0.94 10/30/2009    Mm Screening Breast Tomo Bilateral  Result Date: 05/24/2016 CLINICAL DATA:  Screening. EXAM: 2D DIGITAL SCREENING BILATERAL MAMMOGRAM WITH CAD AND ADJUNCT TOMO COMPARISON:  Previous exam(s). ACR Breast Density Category b: There are scattered areas of fibroglandular density. FINDINGS: There are no findings suspicious for malignancy. Images were processed with CAD. IMPRESSION: No mammographic evidence of malignancy. A result letter of this screening mammogram will be mailed directly to the patient. RECOMMENDATION: Screening mammogram in one year. (Code:SM-B-01Y) BI-RADS CATEGORY  1: Negative. Electronically Signed   By: Claudie Revering M.D.   On: 05/25/2016 07:42    Assessment & Plan:   Donelda was seen today for tick removal.  Diagnoses and all orders for this visit:  Infected insect bite of left thigh, initial encounter- As below -     doxycycline (VIBRA-TABS) 100 MG tablet; Take 1 tablet (100 mg total) by mouth 2 (two) times daily for 21 days. -     triamcinolone cream (KENALOG) 0.5 %; Apply 1 application topically 3 (three) times daily.  Tick bite of left lower leg, initial encounter- I have recommended that she apply a topical steroid to treat the itching.  I also recommended a 3-week course of doxycycline to prophylax against tickborne illness such as Lyme disease or [facility] spotted fever. -     doxycycline (VIBRA-TABS) 100 MG tablet; Take 1 tablet (100 mg total) by mouth 2 (two) times daily for 21 days. -     triamcinolone cream (KENALOG) 0.5 %; Apply 1 application topically 3 (three) times daily.  Other orders -     Tdap vaccine greater than or equal to 7yo IM   I am having Melody Lewis start on doxycycline and triamcinolone cream. I am also having her maintain  her valACYclovir, cyclobenzaprine, mupirocin nasal ointment, levocetirizine, ibuprofen, and nitrofurantoin (macrocrystal-monohydrate).  Meds ordered this encounter  Medications  . doxycycline (VIBRA-TABS) 100 MG tablet    Sig: Take 1 tablet (100 mg total) by mouth 2 (two) times daily for 21 days.    Dispense:  42 tablet    Refill:  0  . triamcinolone cream (KENALOG) 0.5 %    Sig: Apply 1 application topically 3 (three) times daily.    Dispense:  30 g    Refill:  1     Follow-up: Return in about 3 weeks (around 04/15/2018).  Scarlette Calico, MD

## 2018-04-17 ENCOUNTER — Encounter: Payer: Self-pay | Admitting: Internal Medicine

## 2018-04-17 MED ORDER — FLUCONAZOLE 150 MG PO TABS: ORAL_TABLET | ORAL | 1 refills | Status: DC

## 2018-12-08 ENCOUNTER — Encounter (HOSPITAL_COMMUNITY): Payer: Self-pay | Admitting: Emergency Medicine

## 2018-12-08 ENCOUNTER — Emergency Department (HOSPITAL_COMMUNITY)
Admission: EM | Admit: 2018-12-08 | Discharge: 2018-12-08 | Disposition: A | Discharge status: Home / Self Care | Payer: BLUE CROSS/BLUE SHIELD | Attending: Emergency Medicine | Admitting: Emergency Medicine

## 2018-12-08 ENCOUNTER — Other Ambulatory Visit: Payer: Self-pay

## 2018-12-08 ENCOUNTER — Emergency Department (HOSPITAL_COMMUNITY): Payer: BLUE CROSS/BLUE SHIELD

## 2018-12-08 DIAGNOSIS — R002 Palpitations: Secondary | ICD-10-CM

## 2018-12-08 DIAGNOSIS — Z79899 Other long term (current) drug therapy: Secondary | ICD-10-CM | POA: Insufficient documentation

## 2018-12-08 LAB — BASIC METABOLIC PANEL
Anion gap: 9 (ref 5–15)
BUN: 15 mg/dL (ref 6–20)
CO2: 26 mmol/L (ref 22–32)
Calcium: 9.7 mg/dL (ref 8.9–10.3)
Chloride: 107 mmol/L (ref 98–111)
Creatinine, Ser: 0.89 mg/dL (ref 0.44–1.00)
GFR calc Af Amer: >60 mL/min (ref >60)
GFR calc non Af Amer: >60 mL/min (ref >60)
Glucose, Bld: 116 mg/dL — ABNORMAL HIGH (ref 70–99)
Potassium: 3.4 mmol/L — ABNORMAL LOW (ref 3.5–5.1)
Sodium: 142 mmol/L (ref 135–145)

## 2018-12-08 LAB — CBC
HCT: 40.2 % (ref 36.0–46.0)
Hemoglobin: 12.5 g/dL (ref 12.0–15.0)
MCH: 30.1 pg (ref 26.0–34.0)
MCHC: 31.1 g/dL (ref 30.0–36.0)
MCV: 96.9 fL (ref 80.0–100.0)
Platelets: 226 10*3/uL (ref 150–400)
RBC: 4.15 MIL/uL (ref 3.87–5.11)
RDW: 13 % (ref 11.5–15.5)
WBC: 7.2 10*3/uL (ref 4.0–10.5)
nRBC: 0 % (ref 0.0–0.2)

## 2018-12-08 LAB — I-STAT BETA HCG BLOOD, ED (MC, WL, AP ONLY): I-stat hCG, quantitative: <5 m[IU]/mL (ref <5)

## 2018-12-08 LAB — I-STAT TROPONIN, ED: Troponin i, poc: 0 ng/mL (ref 0.00–0.08)

## 2018-12-08 MED ORDER — LORAZEPAM 1 MG PO TABS: 1.0000 mg | ORAL_TABLET | Freq: Three times a day (TID) | ORAL | 0 refills | Status: DC | PRN

## 2018-12-08 NOTE — ED Triage Notes (Signed)
Patient is complaining of heart was beating fast. It woke her out of her sleep. Patient states that it didn't stop until she got to the ED.

## 2018-12-08 NOTE — ED Provider Notes (Signed)
[facility] DEPT Provider Note: Georgena Spurling, MD, FACEP  CSN: [id number] MRN: [id number] ARRIVAL: 12/08/18 at [place]: [place]  Palpitations   HISTORY OF PRESENT ILLNESS  12/08/18 5:44 AM Melody Lewis is a 57 y.o. female who awoke from sleep with a sensation that her heart was beating fast. She awoke during a dream about running from an attacker.  Her husband confirmed that it was beating fast.  She denies associated chest pain or shortness of breath but she did feel very anxious.  She has lost 3 family members this month and this is elevated her anxiety level.  Symptoms persisted until she got to the ED. She is now asymptomatic.   Past Medical History:  Diagnosis Date  . ALLERGIC RHINITIS 10/18/2008  . Allergy   . BACK PAIN, UPPER 10/18/2008  . Blood transfusion without reported diagnosis   . Dysuria 10/18/2010  . GERD (gastroesophageal reflux disease) 06/20/2011  . INTERMITTENT VERTIGO 11/01/2009  . KNEE PAIN, RIGHT 10/18/2008  . LOW BACK PAIN 10/18/2008  . LUMBAR RADICULOPATHY, RIGHT 06/28/2009  . Seasonal allergies   . SHOULDER PAIN, LEFT 10/18/2010  . SINUSITIS- ACUTE-NOS 12/22/2009  . UNSPECIFIED NEURALGIA NEURITIS AND RADICULITIS 10/18/2008  . WRIST PAIN, RIGHT 10/18/2008    Past Surgical History:  Procedure Laterality Date  . ABDOMINAL HYSTERECTOMY  05/2005   due to fibroids, still born, ovaries intact  . CESAREAN SECTION  1983  . COLONOSCOPY  08-04-2009  . DILATION AND CURETTAGE OF UTERUS  2006  . TONSILLECTOMY  1987    Family History  Problem Relation Age of Onset  . Arthritis Mother   . Kidney disease Mother   . Hypertension Mother   . Arthritis Father   . Diabetes Father   . Parkinsonism Father   . Dementia Father   . Colon cancer Father   . Hypertension Brother   . Hypertension Daughter   . Colon cancer Maternal Aunt   . Rectal cancer Neg Hx   . Stomach cancer Neg Hx     Social History   Tobacco Use  . Smoking status:  Never Smoker  . Smokeless tobacco: Never Used  Substance Use Topics  . Alcohol use: No  . Drug use: No    Prior to Admission medications   Medication Sig Start Date End Date Taking? Authorizing Provider  cyclobenzaprine (FLEXERIL) 5 MG tablet Take 1 tablet (5 mg total) by mouth 3 (three) times daily as needed for muscle spasms. 07/04/17   Biagio Borg, MD  fluconazole (DIFLUCAN) 150 MG tablet 1 tab by mouth every 3 days as needed 04/17/18   Biagio Borg, MD  ibuprofen (ADVIL,MOTRIN) 800 MG tablet Take 1 tablet (800 mg total) by mouth every 8 (eight) hours as needed. 07/04/17   Biagio Borg, MD  levocetirizine (XYZAL) 5 MG tablet TAKE ONE TABLET BY MOUTH IN THE EVENING AS NEEDED 07/04/17   Biagio Borg, MD  mupirocin nasal ointment (BACTROBAN) 2 % Place 1 application into the nose as needed. Use one-half of tube in each nostril twice daily for five (5) days. After application, press sides of nose together and gently massage. 07/04/17   Biagio Borg, MD  nitrofurantoin, macrocrystal-monohydrate, (MACROBID) 100 MG capsule Take 1 capsule (100 mg total) by mouth 2 (two) times daily. 03/17/18   Marrian Salvage, FNP  triamcinolone cream (KENALOG) 0.5 % Apply 1 application topically 3 (three) times daily. 03/25/18   Janith Lima, MD  valACYclovir (VALTREX) 1000 MG tablet Take 2 tablets (2,000 mg total) by mouth 2 (two) times daily. For one day for outbreak of sores 02/14/16   Burns, Claudina Lick, MD  mometasone (NASONEX) 50 MCG/ACT nasal spray 2 sprays by Nasal route daily. 05/04/11 08/16/12  Biagio Borg, MD    Allergies Sulfamethoxazole-trimethoprim   REVIEW OF SYSTEMS  Negative except as noted here or in the History of Present Illness.   PHYSICAL EXAMINATION  Initial Vital Signs Blood pressure (!) 160/92, pulse 87, temperature 98.6 F (37 C), temperature source Oral, resp. rate 11, height 5\' 4"  (1.626 m), weight 108.4 kg, SpO2 100 %.  Examination General: Well-developed, well-nourished  female in no acute distress; appearance consistent with age of record HENT: normocephalic; atraumatic Eyes: pupils equal, round and reactive to light; extraocular muscles intact Neck: supple Heart: regular rate and rhythm Lungs: clear to auscultation bilaterally Abdomen: soft; nondistended; nontender; bowel sounds present Extremities: No deformity; full range of motion; pulses normal Neurologic: Awake, alert and oriented; motor function intact in all extremities and symmetric; no facial droop Skin: Warm and dry Psychiatric: Normal mood and affect   RESULTS  Summary of this visit's results, reviewed by myself:   EKG Interpretation  Date/Time:  Monday December 08 2018 03:30:03 EST Ventricular Rate:  88 PR Interval:    QRS Duration: 90 QT Interval:  368 QTC Calculation: 446 R Axis:   24 Text Interpretation:  Sinus rhythm Ventricular premature complex Prolonged PR interval Borderline T abnormalities, inferior leads No significant change was found except PVC Confirmed by Shanon Rosser [phone]) on 12/08/2018 4:01:05 AM      Laboratory Studies: Results for orders placed or performed during the hospital encounter of 12/08/18 (from the past 24 hour(s))  Basic metabolic panel     Status: Abnormal   Collection Time: 12/08/18  3:37 AM  Result Value Ref Range   Sodium 142 135 - 145 mmol/L   Potassium 3.4 (L) 3.5 - 5.1 mmol/L   Chloride 107 98 - 111 mmol/L   CO2 26 22 - 32 mmol/L   Glucose, Bld 116 (H) 70 - 99 mg/dL   BUN 15 6 - 20 mg/dL   Creatinine, Ser 0.89 0.44 - 1.00 mg/dL   Calcium 9.7 8.9 - 10.3 mg/dL   GFR calc non Af Amer >60 >60 mL/min   GFR calc Af Amer >60 >60 mL/min   Anion gap 9 5 - 15  CBC     Status: None   Collection Time: 12/08/18  3:37 AM  Result Value Ref Range   WBC 7.2 4.0 - 10.5 K/uL   RBC 4.15 3.87 - 5.11 MIL/uL   Hemoglobin 12.5 12.0 - 15.0 g/dL   HCT 40.2 36.0 - 46.0 %   MCV 96.9 80.0 - 100.0 fL   MCH 30.1 26.0 - 34.0 pg   MCHC 31.1 30.0 - 36.0 g/dL    RDW 13.0 11.5 - 15.5 %   Platelets 226 150 - 400 K/uL   nRBC 0.0 0.0 - 0.2 %  I-stat troponin, ED     Status: None   Collection Time: 12/08/18  3:45 AM  Result Value Ref Range   Troponin i, poc 0.00 0.00 - 0.08 ng/mL   Comment 3          I-Stat beta hCG blood, ED     Status: None   Collection Time: 12/08/18  3:46 AM  Result Value Ref Range   I-stat hCG, quantitative <5.0 <5 mIU/mL   Comment  3           Imaging Studies: Dg Chest 2 View  Result Date: 12/08/2018 CLINICAL DATA:  Acute onset of tachycardia. EXAM: CHEST - 2 VIEW COMPARISON:  Chest radiograph performed 12/26/2015 FINDINGS: The lungs are well-aerated and clear. There is no evidence of focal opacification, pleural effusion or pneumothorax. The heart is normal in size; the mediastinal contour is within normal limits. No acute osseous abnormalities are seen. IMPRESSION: No acute cardiopulmonary process seen. Electronically Signed   By: Garald Balding M.D.   On: 12/08/2018 04:35    ED COURSE and MDM  Nursing notes and initial vitals signs, including pulse oximetry, reviewed.  Vitals:   12/08/18 0329 12/08/18 0516 12/08/18 0519 12/08/18 0522  BP: (!) 158/85  (!) 160/92 (!) 160/92  Pulse: 89  92 87  Resp: 14  15 11   Temp: 98.6 F (37 C)  98.6 F (37 C) 98.6 F (37 C)  TempSrc: Oral  Oral Oral  SpO2: 100% 100% 100% 100%  Weight: 108.4 kg     Height: 5\' 4"  (1.626 m)      5:59 AM The patient remains asymptomatic with normal sinus rhythm on the monitor.  This may represent a panic attack or it could have been a tachyarrhythmia.   PROCEDURES    ED DIAGNOSES     ICD-10-CM   1. Heart palpitations R00.2        Alter Moss, Jenny Reichmann, MD 12/08/18 [phone]

## 2018-12-08 NOTE — ED Notes (Signed)
NOTED ANXIETY RELATED VERBAL Pt verbalized having bad dreams, and has loss 4  People recently. Stress noted

## 2018-12-19 ENCOUNTER — Encounter: Payer: Self-pay | Admitting: Family

## 2018-12-19 ENCOUNTER — Ambulatory Visit: Payer: BLUE CROSS/BLUE SHIELD | Admitting: Family

## 2018-12-19 VITALS — BP 134/82 | HR 88 | Temp 98.0°F | Ht 64.0 in | Wt 241.1 lb

## 2018-12-19 DIAGNOSIS — S80862A Insect bite (nonvenomous), left lower leg, initial encounter: Secondary | ICD-10-CM

## 2018-12-19 DIAGNOSIS — B009 Herpesviral infection, unspecified: Secondary | ICD-10-CM | POA: Diagnosis not present

## 2018-12-19 DIAGNOSIS — L989 Disorder of the skin and subcutaneous tissue, unspecified: Secondary | ICD-10-CM | POA: Diagnosis not present

## 2018-12-19 DIAGNOSIS — J019 Acute sinusitis, unspecified: Secondary | ICD-10-CM | POA: Diagnosis not present

## 2018-12-19 DIAGNOSIS — W57XXXA Bitten or stung by nonvenomous insect and other nonvenomous arthropods, initial encounter: Secondary | ICD-10-CM

## 2018-12-19 MED ORDER — AMOXICILLIN-POT CLAVULANATE 875-125 MG PO TABS: 1.0000 | ORAL_TABLET | Freq: Two times a day (BID) | ORAL | 0 refills | Status: DC

## 2018-12-19 MED ORDER — LEVOCETIRIZINE DIHYDROCHLORIDE 5 MG PO TABS: ORAL_TABLET | ORAL | 3 refills | Status: DC

## 2018-12-19 MED ORDER — TRIAMCINOLONE ACETONIDE 0.5 % EX CREA: 1.0000 "application " | TOPICAL_CREAM | Freq: Three times a day (TID) | CUTANEOUS | 0 refills | Status: DC

## 2018-12-19 MED ORDER — VALACYCLOVIR HCL 500 MG PO TABS: 500.0000 mg | ORAL_TABLET | Freq: Every day | ORAL | 6 refills | Status: DC

## 2018-12-19 MED ORDER — FLUTICASONE PROPIONATE 50 MCG/ACT NA SUSP: 2.0000 | Freq: Every day | NASAL | 11 refills | Status: DC | PRN

## 2018-12-19 NOTE — Progress Notes (Signed)
Melody Lewis is a 58 y.o. female with the following history as recorded in [employer]:  Patient Active Problem List   Diagnosis Date Noted  . Infected insect bite of left thigh 03/25/2018  . Tick bite of left lower leg 03/25/2018  . Acute viral syndrome 02/26/2018  . Acute sinus infection 02/24/2016  . Radiculitis of left cervical region 02/24/2016  . Frozen shoulder syndrome 01/03/2016  . Supraclavicular lymphadenopathy 12/26/2015  . Nasal sore 12/26/2015  . Right shoulder pain 03/17/2015  . Cystitis 04/20/2014  . Urinary frequency 03/03/2013  . Stress incontinence in female 03/03/2013  . Vaginal dryness, menopausal 03/03/2013  . GERD (gastroesophageal reflux disease) 06/20/2011  . Preventative health care 05/04/2011  . LUMBAR RADICULOPATHY, RIGHT 06/28/2009  . Allergic rhinitis 10/18/2008  . LOW BACK PAIN 10/18/2008  . BACK PAIN, UPPER 10/18/2008    Current Outpatient Medications  Medication Sig Dispense Refill  . amoxicillin-clavulanate (AUGMENTIN) 875-125 MG tablet Take 1 tablet by mouth 2 (two) times daily. 20 tablet 0  . fluticasone (FLONASE) 50 MCG/ACT nasal spray Place 2 sprays into both nostrils daily as needed for allergies or rhinitis. 16 g 11  . levocetirizine (XYZAL) 5 MG tablet TAKE ONE TABLET BY MOUTH IN THE EVENING AS NEEDED 90 tablet 3  . LORazepam (ATIVAN) 1 MG tablet Take 1 tablet (1 mg total) by mouth every 8 (eight) hours as needed for anxiety. 15 tablet 0  . triamcinolone cream (KENALOG) 0.5 % Apply 1 application topically 3 (three) times daily. 30 g 0  . valACYclovir (VALTREX) 500 MG tablet Take 1 tablet (500 mg total) by mouth daily. For one day for outbreak of sores 30 tablet 6   No current facility-administered medications for this visit.     Allergies: Sulfamethoxazole-trimethoprim  Past Medical History:  Diagnosis Date  . ALLERGIC RHINITIS 10/18/2008  . Allergy   . BACK PAIN, UPPER 10/18/2008  . Blood transfusion without reported diagnosis   .  Dysuria 10/18/2010  . GERD (gastroesophageal reflux disease) 06/20/2011  . INTERMITTENT VERTIGO 11/01/2009  . KNEE PAIN, RIGHT 10/18/2008  . LOW BACK PAIN 10/18/2008  . LUMBAR RADICULOPATHY, RIGHT 06/28/2009  . Seasonal allergies   . SHOULDER PAIN, LEFT 10/18/2010  . SINUSITIS- ACUTE-NOS 12/22/2009  . UNSPECIFIED NEURALGIA NEURITIS AND RADICULITIS 10/18/2008  . WRIST PAIN, RIGHT 10/18/2008    Past Surgical History:  Procedure Laterality Date  . ABDOMINAL HYSTERECTOMY  05/2005   due to fibroids, still born, ovaries intact  . CESAREAN SECTION  1983  . COLONOSCOPY  08-04-2009  . DILATION AND CURETTAGE OF UTERUS  2006  . TONSILLECTOMY  1987    Family History  Problem Relation Age of Onset  . Arthritis Mother   . Kidney disease Mother   . Hypertension Mother   . Arthritis Father   . Diabetes Father   . Parkinsonism Father   . Dementia Father   . Colon cancer Father   . Hypertension Brother   . Hypertension Daughter   . Colon cancer Maternal Aunt   . Rectal cancer Neg Hx   . Stomach cancer Neg Hx     Social History   Tobacco Use  . Smoking status: Never Smoker  . Smokeless tobacco: Never Used  Substance Use Topics  . Alcohol use: No    Subjective:  Patient presents with concerns for possible sinus infection; symptoms x 4-5 days; using OTC Sudafed with limited benefit; is prone to sinus infection; denies any fever; no chest pain or shortness of breath;  occasional cough- does have post-nasal drip;  Also feels that she has been having increased problems with cold sores in the past year; feels that she may have an outbreak at least 2-3 x per month; requesting refill on the Triamcinolone she uses to help with the pain; Requesting mole on right upper shoulder to be evaluated- feels like area is changing/ dark/ scaly; also concerned about numerous skin tags;      Objective:  Vitals:   12/19/18 1437  BP: 134/82  Pulse: 88  Temp: 98 F (36.7 C)  TempSrc: Oral  SpO2: 96%  Weight:  241 lb 1.3 oz (109.4 kg)  Height: 5\' 4"  (1.626 m)    General: Well developed, well nourished, in no acute distress  Skin : Warm and dry. Suspect SK on right upper arm Head: Normocephalic and atraumatic  Eyes: Sclera and conjunctiva clear; pupils round and reactive to light; extraocular movements intact  Ears: External normal; canals clear; tympanic membranes normal  Oropharynx: Pink, supple. No suspicious lesions  Neck: Supple without thyromegaly, adenopathy  Lungs: Respirations unlabored; clear to auscultation bilaterally without wheeze, rales, rhonchi  CVS exam: normal rate and regular rhythm.  Neurologic: Alert and oriented; speech intact; face symmetrical; moves all extremities well; CNII-XII intact without focal deficit   Assessment:  1. Acute sinusitis, recurrence not specified, unspecified location   2. Skin lesion   3. Tick bite of left lower leg, initial encounter   4. HSV-1 (herpes simplex virus 1) infection     Plan:  1. Suspect allergic component; refill in Xyzal and Flonase; Rx for Augmentin 875 mg bid x 10 days; follow-up worse, no better. 2. Refer to dermatology; 4. Trial of Valtrex 500 mg qd x 6 months for prevention as opposed to episodic treatment.      No follow-ups on file.  Orders Placed This Encounter  Procedures  . Ambulatory referral to Dermatology    Referral Priority:   Routine    Referral Type:   Consultation    Referral Reason:   Specialty Services Required    Requested Specialty:   Dermatology    Number of Visits Requested:   1    Requested Prescriptions   Signed Prescriptions Disp Refills  . fluticasone (FLONASE) 50 MCG/ACT nasal spray 16 g 11    Sig: Place 2 sprays into both nostrils daily as needed for allergies or rhinitis.  Marland Kitchen levocetirizine (XYZAL) 5 MG tablet 90 tablet 3    Sig: TAKE ONE TABLET BY MOUTH IN THE EVENING AS NEEDED  . triamcinolone cream (KENALOG) 0.5 % 30 g 0    Sig: Apply 1 application topically 3 (three) times daily.  .  valACYclovir (VALTREX) 500 MG tablet 30 tablet 6    Sig: Take 1 tablet (500 mg total) by mouth daily. For one day for outbreak of sores  . amoxicillin-clavulanate (AUGMENTIN) 875-125 MG tablet 20 tablet 0    Sig: Take 1 tablet by mouth 2 (two) times daily.

## 2019-01-23 ENCOUNTER — Encounter: Payer: Self-pay | Admitting: Family

## 2019-01-23 ENCOUNTER — Ambulatory Visit: Payer: BLUE CROSS/BLUE SHIELD | Admitting: Family

## 2019-01-23 VITALS — BP 138/80 | HR 86 | Temp 98.4°F | Ht 64.0 in | Wt 237.0 lb

## 2019-01-23 DIAGNOSIS — B9789 Other viral agents as the cause of diseases classified elsewhere: Secondary | ICD-10-CM

## 2019-01-23 DIAGNOSIS — J069 Acute upper respiratory infection, unspecified: Secondary | ICD-10-CM

## 2019-01-23 MED ORDER — PROMETHAZINE-DM 6.25-15 MG/5ML PO SYRP: 5.0000 mL | ORAL_SOLUTION | Freq: Four times a day (QID) | ORAL | 0 refills | Status: DC | PRN

## 2019-01-23 MED ORDER — AZITHROMYCIN 250 MG PO TABS: ORAL_TABLET | ORAL | 0 refills | Status: DC

## 2019-01-23 NOTE — Progress Notes (Signed)
Melody Lewis is a 58 y.o. female with the following history as recorded in Epic[employer]:  Patient Active Problem List   Diagnosis Date Noted  . Infected insect bite of left thigh 03/25/2018  . Tick bite of left lower leg 03/25/2018  . Acute viral syndrome 02/26/2018  . Acute sinus infection 02/24/2016  . Radiculitis of left cervical region 02/24/2016  . Frozen shoulder syndrome 01/03/2016  . Supraclavicular lymphadenopathy 12/26/2015  . Nasal sore 12/26/2015  . Right shoulder pain 03/17/2015  . Cystitis 04/20/2014  . Urinary frequency 03/03/2013  . Stress incontinence in female 03/03/2013  . Vaginal dryness, menopausal 03/03/2013  . GERD (gastroesophageal reflux disease) 06/20/2011  . Preventative health care 05/04/2011  . LUMBAR RADICULOPATHY, RIGHT 06/28/2009  . Allergic rhinitis 10/18/2008  . LOW BACK PAIN 10/18/2008  . BACK PAIN, UPPER 10/18/2008    Current Outpatient Medications  Medication Sig Dispense Refill  . fluticasone (FLONASE) 50 MCG/ACT nasal spray Place 2 sprays into both nostrils daily as needed for allergies or rhinitis. 16 g 11  . levocetirizine (XYZAL) 5 MG tablet TAKE ONE TABLET BY MOUTH IN THE EVENING AS NEEDED 90 tablet 3  . LORazepam (ATIVAN) 1 MG tablet Take 1 tablet (1 mg total) by mouth every 8 (eight) hours as needed for anxiety. 15 tablet 0  . triamcinolone cream (KENALOG) 0.5 % Apply 1 application topically 3 (three) times daily. 30 g 0  . valACYclovir (VALTREX) 500 MG tablet Take 1 tablet (500 mg total) by mouth daily. For one day for outbreak of sores 30 tablet 6  . azithromycin (ZITHROMAX) 250 MG tablet 2 tabs po qd x 1 day; 1 tablet per day x 4 days; 6 tablet 0  . promethazine-dextromethorphan (PROMETHAZINE-DM) 6.25-15 MG/5ML syrup Take 5 mLs by mouth 4 (four) times daily as needed for cough. 118 mL 0   No current facility-administered medications for this visit.     Allergies: Sulfamethoxazole-trimethoprim  Past Medical History:  Diagnosis Date   . ALLERGIC RHINITIS 10/18/2008  . Allergy   . BACK PAIN, UPPER 10/18/2008  . Blood transfusion without reported diagnosis   . Dysuria 10/18/2010  . GERD (gastroesophageal reflux disease) 06/20/2011  . INTERMITTENT VERTIGO 11/01/2009  . KNEE PAIN, RIGHT 10/18/2008  . LOW BACK PAIN 10/18/2008  . LUMBAR RADICULOPATHY, RIGHT 06/28/2009  . Seasonal allergies   . SHOULDER PAIN, LEFT 10/18/2010  . SINUSITIS- ACUTE-NOS 12/22/2009  . UNSPECIFIED NEURALGIA NEURITIS AND RADICULITIS 10/18/2008  . WRIST PAIN, RIGHT 10/18/2008    Past Surgical History:  Procedure Laterality Date  . ABDOMINAL HYSTERECTOMY  05/2005   due to fibroids, still born, ovaries intact  . CESAREAN SECTION  1983  . COLONOSCOPY  08-04-2009  . DILATION AND CURETTAGE OF UTERUS  2006  . TONSILLECTOMY  1987    Family History  Problem Relation Age of Onset  . Arthritis Mother   . Kidney disease Mother   . Hypertension Mother   . Arthritis Father   . Diabetes Father   . Parkinsonism Father   . Dementia Father   . Colon cancer Father   . Hypertension Brother   . Hypertension Daughter   . Colon cancer Maternal Aunt   . Rectal cancer Neg Hx   . Stomach cancer Neg Hx     Social History   Tobacco Use  . Smoking status: Never Smoker  . Smokeless tobacco: Never Used  Substance Use Topics  . Alcohol use: No    Subjective:  2 day history of cough,  congestion; +tickle in back of throat." ran a low grade fever last night- did have some sweats last night; using Xyzal and Flonase with some benefit;  No chest pain, no shortness of breath.    Objective:  Vitals:   01/23/19 1314  BP: 138/80  Pulse: 86  Temp: 98.4 F (36.9 C)  TempSrc: Oral  SpO2: 99%  Weight: 237 lb 0.6 oz (107.5 kg)  Height: 5\' 4"  (1.626 m)    General: Well developed, well nourished, in no acute distress  Skin : Warm and dry.  Head: Normocephalic and atraumatic  Eyes: Sclera and conjunctiva clear; pupils round and reactive to light; extraocular movements  intact  Ears: External normal; canals clear; tympanic membranes normal  Oropharynx: Pink, supple. No suspicious lesions  Neck: Supple without thyromegaly, adenopathy  Lungs: Respirations unlabored; clear to auscultation bilaterally without wheeze, rales, rhonchi  CVS exam: normal rate and regular rhythm.  Neurologic: Alert and oriented; speech intact; face symmetrical; moves all extremities well; CNII-XII intact without focal deficit   Assessment:  1. Viral URI with cough     Plan:  Reassurance given; suspect viral; symptomatic treatment discussed; Rx for Promethazine- DM cough syrup to use as directed; work note x today and tomorrow; increase fluids, rest and follow-up worse, no better.   No follow-ups on file.  No orders of the defined types were placed in this encounter.   Requested Prescriptions   Signed Prescriptions Disp Refills  . azithromycin (ZITHROMAX) 250 MG tablet 6 tablet 0    Sig: 2 tabs po qd x 1 day; 1 tablet per day x 4 days;  . promethazine-dextromethorphan (PROMETHAZINE-DM) 6.25-15 MG/5ML syrup 118 mL 0    Sig: Take 5 mLs by mouth 4 (four) times daily as needed for cough.

## 2019-02-09 ENCOUNTER — Encounter: Payer: Self-pay | Admitting: Family

## 2019-02-09 ENCOUNTER — Other Ambulatory Visit: Payer: Self-pay | Admitting: Family

## 2019-02-09 MED ORDER — FLUCONAZOLE 150 MG PO TABS: 150.0000 mg | ORAL_TABLET | Freq: Once | ORAL | 0 refills | Status: AC

## 2019-08-06 ENCOUNTER — Ambulatory Visit (INDEPENDENT_AMBULATORY_CARE_PROVIDER_SITE_OTHER): Payer: BC Managed Care – PPO

## 2019-08-06 ENCOUNTER — Other Ambulatory Visit: Payer: Self-pay

## 2019-08-06 DIAGNOSIS — Z23 Encounter for immunization: Secondary | ICD-10-CM

## 2019-08-13 ENCOUNTER — Ambulatory Visit: Payer: BLUE CROSS/BLUE SHIELD

## 2019-10-09 ENCOUNTER — Encounter: Payer: Self-pay | Admitting: Internal Medicine

## 2019-10-14 ENCOUNTER — Encounter: Payer: BC Managed Care – PPO | Admitting: Internal Medicine

## 2019-11-04 ENCOUNTER — Encounter: Payer: Self-pay | Admitting: Internal Medicine

## 2019-11-04 ENCOUNTER — Ambulatory Visit (AMBULATORY_SURGERY_CENTER): Payer: BC Managed Care – PPO | Admitting: *Deleted

## 2019-11-04 ENCOUNTER — Other Ambulatory Visit: Payer: Self-pay

## 2019-11-04 VITALS — Temp 96.9°F | Ht 64.0 in | Wt 236.0 lb

## 2019-11-04 DIAGNOSIS — Z800 Family history of malignant neoplasm of digestive organs: Secondary | ICD-10-CM

## 2019-11-04 DIAGNOSIS — Z1159 Encounter for screening for other viral diseases: Secondary | ICD-10-CM

## 2019-11-04 MED ORDER — SUPREP BOWEL PREP KIT 17.5-3.13-1.6 GM/177ML PO SOLN: 1.0000 | Freq: Once | ORAL | 0 refills | Status: AC

## 2019-11-04 NOTE — Progress Notes (Signed)
No egg or soy allergy known to patient  No issues with past sedation with any surgeries  or procedures, no intubation problems  No diet pills per patient No home 02 use per patient  No blood thinners per patient  Pt denies issues with constipation  No A fib or A flutter  EMMI video sent to pt's e mail   suprep $15 coupon   cov test 12-4 at 110 pm   Due to the COVID-19 pandemic we are asking patients to follow these guidelines. Please only bring one care partner. Please be aware that your care partner may wait in the car in the parking lot or if they feel like they will be too hot to wait in the car, they may wait in the lobby on the 4th floor. All care partners are required to wear a mask the entire time (we do not have any that we can provide them), they need to practice social distancing, and we will do a Covid check for all patient's and care partners when you arrive. Also we will check their temperature and your temperature. If the care partner waits in their car they need to stay in the parking lot the entire time and we will call them on their cell phone when the patient is ready for discharge so they can bring the car to the front of the building. Also all patient's will need to wear a mask into building.

## 2019-11-13 ENCOUNTER — Ambulatory Visit (INDEPENDENT_AMBULATORY_CARE_PROVIDER_SITE_OTHER): Payer: BC Managed Care – PPO

## 2019-11-13 ENCOUNTER — Other Ambulatory Visit: Payer: Self-pay | Admitting: Internal Medicine

## 2019-11-13 DIAGNOSIS — Z1159 Encounter for screening for other viral diseases: Secondary | ICD-10-CM

## 2019-11-13 LAB — SARS CORONAVIRUS 2 (TAT 6-24 HRS): SARS Coronavirus 2: NEGATIVE

## 2019-11-18 ENCOUNTER — Other Ambulatory Visit: Payer: Self-pay

## 2019-11-18 ENCOUNTER — Encounter: Payer: Self-pay | Admitting: Internal Medicine

## 2019-11-18 ENCOUNTER — Ambulatory Visit (AMBULATORY_SURGERY_CENTER): Payer: BC Managed Care – PPO | Admitting: Internal Medicine

## 2019-11-18 VITALS — BP 133/79 | HR 72 | Temp 98.6°F | Resp 14 | Ht 64.0 in | Wt 236.0 lb

## 2019-11-18 DIAGNOSIS — Z1211 Encounter for screening for malignant neoplasm of colon: Secondary | ICD-10-CM

## 2019-11-18 DIAGNOSIS — Z800 Family history of malignant neoplasm of digestive organs: Secondary | ICD-10-CM | POA: Diagnosis present

## 2019-11-18 DIAGNOSIS — D125 Benign neoplasm of sigmoid colon: Secondary | ICD-10-CM | POA: Diagnosis not present

## 2019-11-18 MED ORDER — SODIUM CHLORIDE 0.9 % IV SOLN: 500.0000 mL | Freq: Once | INTRAVENOUS | Status: DC

## 2019-11-18 NOTE — Op Note (Signed)
[facility] Patient Name: Melody Lewis Procedure Date: 11/18/2019 9:43 AM MRN: [id number] Endoscopist: Docia Chuck. Henrene Pastor , MD Age: 58 Referring MD:  Date of Birth: 07-Oct-1961 Gender: Female Account #: [id number] Procedure:                Colonoscopy with cold snare polypectomy x 1 Indications:              Screening for colorectal malignant neoplasm. High                            risk. Family history of colon cancer in father                            around age 59. Previous examinations 2010 and 2015                            both negative for neoplasia Medicines:                Monitored Anesthesia Care Procedure:                Pre-Anesthesia Assessment:                           - Prior to the procedure, a History and Physical                            was performed, and patient medications and                            allergies were reviewed. The patient's tolerance of                            previous anesthesia was also reviewed. The risks                            and benefits of the procedure and the sedation                            options and risks were discussed with the patient.                            All questions were answered, and informed consent                            was obtained. Prior Anticoagulants: The patient has                            taken no previous anticoagulant or antiplatelet                            agents. After reviewing the risks and benefits, the                            patient was deemed in satisfactory condition to  undergo the procedure.                           After obtaining informed consent, the colonoscope                            was passed under direct vision. Throughout the                            procedure, the patient's blood pressure, pulse, and                            oxygen saturations were monitored continuously. The                            Colonoscope was  introduced through the anus and                            advanced to the the cecum, identified by                            appendiceal orifice and ileocecal valve. The                            ileocecal valve, appendiceal orifice, and rectum                            were photographed. The quality of the bowel                            preparation was excellent. The colonoscopy was                            performed without difficulty. The patient tolerated                            the procedure well. The bowel preparation used was                            SUPREP via split dose instruction. Scope In: 9:55:59 AM Scope Out: 10:09:30 AM Scope Withdrawal Time: 0 hours 8 minutes 12 seconds  Total Procedure Duration: 0 hours 13 minutes 31 seconds  Findings:                 A 3 mm polyp was found in the sigmoid colon. The                            polyp was removed with a cold snare. Resection and                            retrieval were complete.                           Internal hemorrhoids were found during retroflexion.  The exam was otherwise without abnormality on                            direct and retroflexion views. Complications:            No immediate complications. Estimated blood loss:                            None. Estimated Blood Loss:     Estimated blood loss: none. Impression:               - One 3 mm polyp in the sigmoid colon, removed with                            a cold snare. Resected and retrieved.                           - Internal hemorrhoids.                           - The examination was otherwise normal on direct                            and retroflexion views. Recommendation:           - Repeat colonoscopy in 5 years for surveillance.                           - Patient has a contact number available for                            emergencies. The signs and symptoms of potential                            delayed  complications were discussed with the                            patient. Return to normal activities tomorrow.                            Written discharge instructions were provided to the                            patient.                           - Resume previous diet.                           - Continue present medications.                           - Await pathology results. Docia Chuck. Henrene Pastor, MD 11/18/2019 10:16:04 AM This report has been signed electronically.

## 2019-11-18 NOTE — Patient Instructions (Signed)
Please read handouts provided. Continue present medications. Await pathology results.        YOU HAD AN ENDOSCOPIC PROCEDURE TODAY AT THE [facility]:   Refer to the procedure report that was given to you for any specific questions about what was found during the examination.  If the procedure report does not answer your questions, please call your gastroenterologist to clarify.  If you requested that your care partner not be given the details of your procedure findings, then the procedure report has been included in a sealed envelope for you to review at your convenience later.  YOU SHOULD EXPECT: Some feelings of bloating in the abdomen. Passage of more gas than usual.  Walking can help get rid of the air that was put into your GI tract during the procedure and reduce the bloating. If you had a lower endoscopy (such as a colonoscopy or flexible sigmoidoscopy) you may notice spotting of blood in your stool or on the toilet paper. If you underwent a bowel prep for your procedure, you may not have a normal bowel movement for a few days.  Please Note:  You might notice some irritation and congestion in your nose or some drainage.  This is from the oxygen used during your procedure.  There is no need for concern and it should clear up in a day or so.  SYMPTOMS TO REPORT IMMEDIATELY:   Following lower endoscopy (colonoscopy or flexible sigmoidoscopy):  Excessive amounts of blood in the stool  Significant tenderness or worsening of abdominal pains  Swelling of the abdomen that is new, acute  Fever of 100F or higher    For urgent or emergent issues, a gastroenterologist can be reached at any hour by calling [phone].   DIET:  We do recommend a small meal at first, but then you may proceed to your regular diet.  Drink plenty of fluids but you should avoid alcoholic beverages for 24 hours.  ACTIVITY:  You should plan to take it easy for the rest of today and you should NOT  DRIVE or use heavy machinery until tomorrow (because of the sedation medicines used during the test).    FOLLOW UP: Our staff will call the number listed on your records 48-72 hours following your procedure to check on you and address any questions or concerns that you may have regarding the information given to you following your procedure. If we do not reach you, we will leave a message.  We will attempt to reach you two times.  During this call, we will ask if you have developed any symptoms of COVID 19. If you develop any symptoms (ie: fever, flu-like symptoms, shortness of breath, cough etc.) before then, please call [phone].  If you test positive for Covid 19 in the 2 weeks post procedure, please call and report this information to us.    If any biopsies were taken you will be contacted by phone or by letter within the next 1-3 weeks.  Please call us at [phone] if you have not heard about the biopsies in 3 weeks.    SIGNATURES/CONFIDENTIALITY: You and/or your care partner have signed paperwork which will be entered into your electronic medical record.  These signatures attest to the fact that that the information above on your After Visit Summary has been reviewed and is understood.  Full responsibility of the confidentiality of this discharge information lies with you and/or your care-partner. 

## 2019-11-18 NOTE — Progress Notes (Signed)
Pt Drowsy. VSS. To PACU, report to RN. No anesthetic complications noted.  

## 2019-11-18 NOTE — Progress Notes (Signed)
Melody Lewis   Pt's states no medical or surgical changes since previsit or office visit.

## 2019-11-18 NOTE — Progress Notes (Signed)
Called to room to assist during endoscopic procedure.  Patient ID and intended procedure confirmed with present staff. Received instructions for my participation in the procedure from the performing physician.  

## 2019-11-20 ENCOUNTER — Encounter: Payer: Self-pay | Admitting: Internal Medicine

## 2019-11-20 ENCOUNTER — Telehealth: Payer: Self-pay | Admitting: *Deleted

## 2019-11-20 NOTE — Telephone Encounter (Signed)
  Follow up Call-  Call back number 11/18/2019  Post procedure Call Back phone  # [phone] cell  Permission to leave phone message Yes  Some recent data might be hidden     Patient questions:  Do you have a fever, pain , or abdominal swelling? No. Pain Score  0 *  Have you tolerated food without any problems? Yes.    Have you been able to return to your normal activities? Yes.    Do you have any questions about your discharge instructions: Diet   No. Medications  No. Follow up visit  No.  Do you have questions or concerns about your Care? No.  Actions: * If pain score is 4 or above: 1. No action needed, pain <4.Have you developed a fever since your procedure? no  2.   Have you had an respiratory symptoms (SOB or cough) since your procedure? no  3.   Have you tested positive for COVID 19 since your procedure no  4.   Have you had any family members/close contacts diagnosed with the COVID 19 since your procedure?  no   If yes to any of these questions please route to Joylene John, RN and Alphonsa Gin, Therapist, sports.

## 2019-12-01 ENCOUNTER — Ambulatory Visit: Payer: Self-pay

## 2019-12-01 ENCOUNTER — Other Ambulatory Visit: Payer: Self-pay

## 2019-12-01 ENCOUNTER — Encounter: Payer: Self-pay | Admitting: Family Medicine

## 2019-12-01 ENCOUNTER — Ambulatory Visit (INDEPENDENT_AMBULATORY_CARE_PROVIDER_SITE_OTHER)
Admission: RE | Admit: 2019-12-01 | Discharge: 2019-12-01 | Disposition: A | Discharge status: Home / Self Care | Payer: BC Managed Care – PPO | Source: Ambulatory Visit | Attending: Family Medicine | Admitting: Family Medicine

## 2019-12-01 ENCOUNTER — Ambulatory Visit: Payer: BC Managed Care – PPO | Admitting: Family Medicine

## 2019-12-01 VITALS — BP 128/88 | HR 83 | Ht 64.0 in | Wt 238.0 lb

## 2019-12-01 DIAGNOSIS — M25572 Pain in left ankle and joints of left foot: Secondary | ICD-10-CM | POA: Diagnosis not present

## 2019-12-01 DIAGNOSIS — M25472 Effusion, left ankle: Secondary | ICD-10-CM

## 2019-12-01 MED ORDER — PREDNISONE 50 MG PO TABS: 50.0000 mg | ORAL_TABLET | Freq: Every day | ORAL | 0 refills | Status: DC

## 2019-12-01 NOTE — Progress Notes (Signed)
I, Wendy Poet, LAT, ATC, am serving as scribe for Dr. Lynne Leader.  Melody Lewis is a 58 y.o. female who presents to [facility] at [facility] today for L ankle pain x 4 days when she slipped and tweaked her L ankle.  She localizes her pain to her L post, medial and anterior ankle.  She rates her pain at a 0/10 at rest and a 7/10 at it's worst.  Pt describes the pain as sharp and stinging.  Pt reports L ankle swelling and radiating pain from her L calcaneus up to her L Achille's.  Aggravating factors include going down stairs.  She has tried ice, compression and IBU.    ROS:  As above  Exam:  BP 128/88 (BP Location: Left Arm, Patient Position: Sitting, Cuff Size: Large)   Pulse 83   Ht 5\' 4"  (1.626 m)   Wt 238 lb (108 kg)   SpO2 98%   BMI 40.85 kg/m  Wt Readings from Last 5 Encounters:  12/01/19 238 lb (108 kg)  11/18/19 236 lb (107 kg)  11/04/19 236 lb (107 kg)  01/23/19 237 lb 0.6 oz (107.5 kg)  12/19/18 241 lb 1.3 oz (109.4 kg)   General: Well Developed, well nourished, and in no acute distress.  Neuro/Psych: Alert and oriented x3, extra-ocular muscles intact, able to move all 4 extremities, sensation grossly intact. Skin: Warm and dry, no rashes noted.  Respiratory: Not using accessory muscles, speaking in full sentences, trachea midline.  Cardiovascular: Pulses palpable, no extremity edema. Abdomen: Does not appear distended. MSK:  Left ankle: Moderate effusion otherwise normal-appearing. Mildly tender palpation posterior lateral ankle.  Minimally tender anterior medial ankle. Not particularly tender at Achilles tendon insertion or plantar calcaneus. Normal foot and ankle motion some pain with passive and active dorsiflexion. Strength intact to dorsiflexion plantarflexion eversion and inversion.  Stable ligamentous exam.  Pulses cap refill and sensation are intact distally.  Lab and Radiology Results  Limited musculoskeletal ultrasound of left  ankle Achilles tendon normal-appearing at insertion.  Minimal retrocalcaneal bursal swelling present. Medial ankle structures normal posterior tibialis tendon flexor digitorum flexor hallucis tendon.  Normal neurovascular structures.  Normal bony structures. Lateral ankle normal peroneal tendon.  Moderate lateral ankle effusion present.  Normal bony structures. Anterior ankle normal extensor tendons and neurovascular structures. Moderate to large anterior ankle effusion present. Normal bony structures otherwise. Impression: Moderate ankle effusion  X-ray left ankle ordered today will be done off-site.  Assessment and Plan: 58 y.o. female with left ankle effusion following injury.  Patient is not particularly tender therefore I think fracture is less likely.  Regardless we will proceed with x-ray for further evaluation and management.  We will treat empirically now with cam walker boot for weightbearing and home exercise program for ankle sprain.  We will tentatively prescribe prednisone assuming x-ray looks normal.  Assuming x-ray is normal prednisone should be helpful.  We will recheck back in a few weeks or sooner if not improving.  Next step would be aspiration and injection if needed.    Orders Placed This Encounter  Procedures  . [place] - LOWER Extremity - Limited - LEFT    Order Specific Question:   Reason for Exam (SYMPTOM  OR DIAGNOSIS REQUIRED)    Answer:   L ankle pain    Order Specific Question:   Preferred imaging location?    Answer:   [place]  . XR Ankle Left    Standing Status:  Future    Standing Expiration Date:   01/30/2021    Order Specific Question:   Preferred imaging location?    Answer:   [facility]    Order Specific Question:   Reason for exam:    Answer:   left ankle pain and effusion    Comments:   Please include AP, Lateral, and mortise views.   Meds ordered this encounter  Medications  . predniSONE (DELTASONE) 50 MG tablet      Sig: Take 1 tablet (50 mg total) by mouth daily.    Dispense:  5 tablet    Refill:  0    Historical information moved to improve visibility of documentation.  Past Medical History:  Diagnosis Date  . ALLERGIC RHINITIS 10/18/2008  . Allergy   . Arthritis    shoulders bursitis  . BACK PAIN, UPPER 10/18/2008  . Blood transfusion without reported diagnosis   . Dysuria 10/18/2010  . GERD (gastroesophageal reflux disease) 06/20/2011   not current  . INTERMITTENT VERTIGO 11/01/2009  . KNEE PAIN, RIGHT 10/18/2008  . LOW BACK PAIN 10/18/2008  . LUMBAR RADICULOPATHY, RIGHT 06/28/2009  . Seasonal allergies   . SHOULDER PAIN, LEFT 10/18/2010  . SINUSITIS- ACUTE-NOS 12/22/2009  . UNSPECIFIED NEURALGIA NEURITIS AND RADICULITIS 10/18/2008  . WRIST PAIN, RIGHT 10/18/2008   Past Surgical History:  Procedure Laterality Date  . ABDOMINAL HYSTERECTOMY  05/2005   due to fibroids, still born, ovaries intact  . CESAREAN SECTION  1983  . COLONOSCOPY  08-04-2009  . DILATION AND CURETTAGE OF UTERUS  2006  . TONSILLECTOMY  1987   Social History   Tobacco Use  . Smoking status: Never Smoker  . Smokeless tobacco: Never Used  Substance Use Topics  . Alcohol use: No   family history includes Arthritis in her father and mother; Colon cancer in her father and maternal aunt; Dementia in her father; Diabetes in her father; Hypertension in her brother, daughter, and mother; Kidney disease in her mother; Parkinsonism in her father.  Medications: Current Outpatient Medications  Medication Sig Dispense Refill  . Acetylcysteine (NAC 600 PO) Take by mouth daily.    . Ascorbic Acid (VITAMIN C) 1000 MG tablet Take 2,000 mg by mouth daily.     . B Complex Vitamins (B COMPLEX 1 PO) Take by mouth daily.    . Cholecalciferol (VITAMIN D3 PO) 5,000 Units daily.     . cyclobenzaprine (FLEXERIL) 10 MG tablet cyclobenzaprine 10 mg tablet    . fluticasone (FLONASE) 50 MCG/ACT nasal spray Place 2 sprays into both nostrils  daily as needed for allergies or rhinitis. 16 g 11  . levocetirizine (XYZAL) 5 MG tablet TAKE ONE TABLET BY MOUTH IN THE EVENING AS NEEDED 90 tablet 3  . MAGNESIUM PO Take by mouth. 2 at bedtime    . Multiple Vitamins-Minerals (ZINC PO) Take by mouth. 54 mg daily    . Probiotic Product (PROBIOTIC DAILY PO) Take by mouth. 50 billion daily    . triamcinolone cream (KENALOG) 0.5 % Apply 1 application topically 3 (three) times daily. 30 g 0  . valACYclovir (VALTREX) 500 MG tablet Take 1 tablet (500 mg total) by mouth daily. For one day for outbreak of sores 30 tablet 6  . vitamin E 400 UNIT capsule Take 400 Units by mouth daily.    . predniSONE (DELTASONE) 50 MG tablet Take 1 tablet (50 mg total) by mouth daily. 5 tablet 0   No current facility-administered medications for this visit.  Allergies  Allergen Reactions  . Sulfamethoxazole-Trimethoprim     REACTION: Faint  . Sulfa Antibiotics Other (See Comments)      Discussed warning signs or symptoms. Please see discharge instructions. Patient expresses understanding.  The above documentation has been reviewed and is accurate and complete Lynne Leader

## 2019-12-01 NOTE — Patient Instructions (Signed)
Thank You have fluid in your left ankle joint.  I am not sure why right now.  We are working to figure it out more today with xray.  Will treat with the boot.  Take prednisone starting tomorrow with normal xray.  Recheck with me in 1-3 weeks especially if not better.

## 2019-12-07 NOTE — Progress Notes (Signed)
X-ray left ankle shows soft tissue swelling but no fractures.

## 2019-12-13 ENCOUNTER — Encounter: Payer: Self-pay | Admitting: Internal Medicine

## 2019-12-14 MED ORDER — MUPIROCIN CALCIUM 2 % NA OINT: 1.0000 "application " | TOPICAL_OINTMENT | Freq: Two times a day (BID) | NASAL | 0 refills | Status: DC

## 2019-12-14 NOTE — Telephone Encounter (Signed)
Med not on med list pls advise if ok to refill.Marland KitchenJohny Chess

## 2019-12-15 ENCOUNTER — Other Ambulatory Visit: Payer: Self-pay

## 2019-12-15 ENCOUNTER — Ambulatory Visit (INDEPENDENT_AMBULATORY_CARE_PROVIDER_SITE_OTHER): Payer: BC Managed Care – PPO

## 2019-12-15 ENCOUNTER — Telehealth: Payer: Self-pay

## 2019-12-15 ENCOUNTER — Encounter: Payer: Self-pay | Admitting: Family Medicine

## 2019-12-15 ENCOUNTER — Ambulatory Visit (INDEPENDENT_AMBULATORY_CARE_PROVIDER_SITE_OTHER): Payer: BC Managed Care – PPO | Admitting: Family Medicine

## 2019-12-15 VITALS — BP 132/90 | HR 96 | Ht 64.0 in | Wt 238.0 lb

## 2019-12-15 DIAGNOSIS — G8929 Other chronic pain: Secondary | ICD-10-CM | POA: Diagnosis not present

## 2019-12-15 DIAGNOSIS — M25511 Pain in right shoulder: Secondary | ICD-10-CM

## 2019-12-15 DIAGNOSIS — M25572 Pain in left ankle and joints of left foot: Secondary | ICD-10-CM

## 2019-12-15 NOTE — Telephone Encounter (Signed)
Copied from [facility] [id number]. Topic: General - Other >> Dec 15, 2019  9:44 AM Rainey Pines A wrote: [facility] is requesting a callback in regards to mupirocin nasal ointment (BACTROBAN) 2 % instructions. Please advise [place], [place]  Phone:  [phone] Fax:  [phone]

## 2019-12-15 NOTE — Patient Instructions (Signed)
Thank you for coming in today. Call or go to the ER if you develop a large red swollen joint with extreme pain or oozing puss.   Recheck in 3 weeks Return sooner if needed.  Work to transition out of boot into ankle brace.

## 2019-12-15 NOTE — Progress Notes (Signed)
I, Wendy Poet, LAT, ATC, am serving as scribe for Dr. Lynne Leader.  Melody Lewis is a 59 y.o. female who presents to [place] [facility] today for L ankle pain.  She was last seen by Dr. Georgina Snell on 12/01/19 and was placed in a walking boot and prescribed prednisone.  She also had a L ankle XR at her last visit.  Since seeing her last, pt reports that her L ankle is about the same.  She states that her L ankle swelling will decrease at times but then comes back.  She states that she took and finished her prednisone but reports feeling very jittery and anxious while taking it.  She states that the majority of her symptoms are along her L lateral malleolus and into her L Achille's.  L ankle eversion aggravates her symptoms.    ROS:  As above  Exam:  BP 132/90 (BP Location: Left Arm, Patient Position: Sitting, Cuff Size: Large)   Pulse 96   Ht 5\' 4"  (1.626 m)   Wt 238 lb (108 kg)   SpO2 98%   BMI 40.85 kg/m  Wt Readings from Last 5 Encounters:  12/15/19 238 lb (108 kg)  12/01/19 238 lb (108 kg)  11/18/19 236 lb (107 kg)  11/04/19 236 lb (107 kg)  01/23/19 237 lb 0.6 oz (107.5 kg)   General: Well Developed, well nourished, and in no acute distress.  Neuro/Psych: Alert and oriented x3, extra-ocular muscles intact, able to move all 4 extremities, sensation grossly intact. Skin: Warm and dry, no rashes noted.  Respiratory: Not using accessory muscles, speaking in full sentences, trachea midline.  Cardiovascular: Pulses palpable, no extremity edema. Abdomen: Does not appear distended. MSK: Left ankle Moderate effusion most prominent at the lateral ankle.  No erythema or skin induration. Normal ankle motion.  Some pain with eversion. Strength is intact.  Pulses cap refill and sensation are intact distally.    Lab and Radiology Results  Procedure: Real-time Ultrasound Guided Injection of left lateral ankle Device: Philips Affiniti 50G Images permanently  stored and available for review in the ultrasound unit. Verbal informed consent obtained.  Discussed risks and benefits of procedure. Warned about infection bleeding damage to structures skin hypopigmentation and fat atrophy among others. Patient expresses understanding and agreement Time-out conducted.   Noted no overlying erythema, induration, or other signs of local infection.   Skin prepped in a sterile fashion.   Local anesthesia: Topical Ethyl chloride.   With sterile technique and under real time ultrasound guidance:  40 mg of Kenalog and 2 mL of Marcaine injected easily.   Completed without difficulty   Pain immediately resolved suggesting accurate placement of the medication.   Advised to call if fevers/chills, erythema, induration, drainage, or persistent bleeding.   Images permanently stored and available for review in the ultrasound unit.  Impression: Technically successful ultrasound guided injection.         Assessment and Plan: 59 y.o. female with left ankle effusion and pain.  Following ankle sprain.  X-ray thankfully normal at last check in with no evidence of fracture.  Patient is overall improved but continues to experience bothersome ankle effusion.  Plan to proceed with an intra-articular injection today.  Will wean out of the Cam walker boot to ASO brace and continue home exercise program.  Check back in about 3 weeks.  Return sooner if needed.  Precautions reviewed.   PDMP not reviewed this encounter. Orders Placed This Encounter  Procedures  . [place] - LOWER Extremity - Limited - LEFT    Order Specific Question:   Reason for Exam (SYMPTOM  OR DIAGNOSIS REQUIRED)    Answer:   L ankle pain    Order Specific Question:   Preferred imaging location?    Answer:   [place]   No orders of the defined types were placed in this encounter.   Historical information moved to improve visibility of documentation.  Past Medical History:    Diagnosis Date  . ALLERGIC RHINITIS 10/18/2008  . Allergy   . Arthritis    shoulders bursitis  . BACK PAIN, UPPER 10/18/2008  . Blood transfusion without reported diagnosis   . Dysuria 10/18/2010  . GERD (gastroesophageal reflux disease) 06/20/2011   not current  . INTERMITTENT VERTIGO 11/01/2009  . KNEE PAIN, RIGHT 10/18/2008  . LOW BACK PAIN 10/18/2008  . LUMBAR RADICULOPATHY, RIGHT 06/28/2009  . Seasonal allergies   . SHOULDER PAIN, LEFT 10/18/2010  . SINUSITIS- ACUTE-NOS 12/22/2009  . UNSPECIFIED NEURALGIA NEURITIS AND RADICULITIS 10/18/2008  . WRIST PAIN, RIGHT 10/18/2008   Past Surgical History:  Procedure Laterality Date  . ABDOMINAL HYSTERECTOMY  05/2005   due to fibroids, still born, ovaries intact  . CESAREAN SECTION  1983  . COLONOSCOPY  08-04-2009  . DILATION AND CURETTAGE OF UTERUS  2006  . TONSILLECTOMY  1987   Social History   Tobacco Use  . Smoking status: Never Smoker  . Smokeless tobacco: Never Used  Substance Use Topics  . Alcohol use: No   family history includes Arthritis in her father and mother; Colon cancer in her father and maternal aunt; Dementia in her father; Diabetes in her father; Hypertension in her brother, daughter, and mother; Kidney disease in her mother; Parkinsonism in her father.  Medications: Current Outpatient Medications  Medication Sig Dispense Refill  . Acetylcysteine (NAC 600 PO) Take by mouth daily.    . Ascorbic Acid (VITAMIN C) 1000 MG tablet Take 2,000 mg by mouth daily.     . B Complex Vitamins (B COMPLEX 1 PO) Take by mouth daily.    . Cholecalciferol (VITAMIN D3 PO) 5,000 Units daily.     . cyclobenzaprine (FLEXERIL) 10 MG tablet cyclobenzaprine 10 mg tablet    . fluticasone (FLONASE) 50 MCG/ACT nasal spray Place 2 sprays into both nostrils daily as needed for allergies or rhinitis. 16 g 11  . levocetirizine (XYZAL) 5 MG tablet TAKE ONE TABLET BY MOUTH IN THE EVENING AS NEEDED 90 tablet 3  . MAGNESIUM PO Take by mouth. 2 at  bedtime    . Multiple Vitamins-Minerals (ZINC PO) Take by mouth. 54 mg daily    . mupirocin nasal ointment (BACTROBAN) 2 % Place 1 application into the nose 2 (two) times daily. Use one-half of tube in each nostril twice daily for five (5) days. After application, press sides of nose together and gently massage. 10 g 0  . Probiotic Product (PROBIOTIC DAILY PO) Take by mouth. 50 billion daily    . triamcinolone cream (KENALOG) 0.5 % Apply 1 application topically 3 (three) times daily. 30 g 0  . valACYclovir (VALTREX) 500 MG tablet Take 1 tablet (500 mg total) by mouth daily. For one day for outbreak of sores 30 tablet 6  . vitamin E 400 UNIT capsule Take 400 Units by mouth daily.     No current facility-administered medications for this visit.   Allergies  Allergen Reactions  . Sulfamethoxazole-Trimethoprim  REACTION: Faint  . Sulfa Antibiotics Other (See Comments)      Discussed warning signs or symptoms. Please see discharge instructions. Patient expresses understanding.  The above documentation has been reviewed and is accurate and complete Lynne Leader

## 2019-12-15 NOTE — Telephone Encounter (Signed)
Notified [employer] for Bactroban 2% with the instructions.

## 2019-12-21 ENCOUNTER — Other Ambulatory Visit: Payer: Self-pay | Admitting: Family

## 2020-01-05 ENCOUNTER — Encounter: Payer: Self-pay | Admitting: Family Medicine

## 2020-01-05 ENCOUNTER — Ambulatory Visit (INDEPENDENT_AMBULATORY_CARE_PROVIDER_SITE_OTHER): Payer: BC Managed Care – PPO | Admitting: Family Medicine

## 2020-01-05 ENCOUNTER — Other Ambulatory Visit: Payer: Self-pay

## 2020-01-05 VITALS — BP 120/78 | HR 88 | Ht 64.0 in | Wt 235.8 lb

## 2020-01-05 DIAGNOSIS — M25472 Effusion, left ankle: Secondary | ICD-10-CM

## 2020-01-05 NOTE — Progress Notes (Signed)
   I, Wendy Poet, LAT, ATC, am serving as scribe for Dr. Lynne Leader.  Melody Lewis is a 59 y.o. female who presents to [place] at [facility] today for f/u of L ankle pain.  Pt was last seen on 12/15/19 and had a L ankle injection and was instructed to wean out of the L ankle boot into an ASO ankle brace.  She has tried prednisone.  Since her last visit, she reports that her L ankle feels better but notes that she con't to get some sharp pain along the distal aspect of her L lateral malleolus.  She also con't to have swelling just medial to her L anterior tib tendon.  She rates her improvement at approximately 50%.  She con't to use the AFO ankle brace and wears it during the day while at work.  Diagnostic testing: L ankle XR - 12/01/19   Pertinent review of systems: No fevers or chills    Exam:  BP 120/78 (BP Location: Left Arm, Patient Position: Sitting, Cuff Size: Large)   Pulse 88   Ht 5\' 4"  (1.626 m)   Wt 235 lb 12.8 oz (107 kg)   SpO2 98%   BMI 40.47 kg/m  General: Well Developed, well nourished, and in no acute distress.   MSK: Left ankle: Still mild swelling anterior lateral ankle.  Minimally tender distal fibula. Normal ankle motion.  Stable ligament exam.  Normal strength.  Normal gait.    Lab and Radiology Results EXAM: LEFT ANKLE COMPLETE - 3+ VIEW 12/01/19 \ COMPARISON:  None  FINDINGS: Mild lateral soft tissue swelling. No acute bony abnormality. Specifically, no fracture, subluxation, or dislocation. Joint spaces maintained.  IMPRESSION: No acute bony abnormality.   Electronically Signed   By: Rolm Baptise M.D.   On: 12/01/2019 17:27  I, Lynne Leader, personally (independently) visualized and performed the interpretation of the images attached in this note.     Assessment and Plan: 60 y.o. female with left ankle pain following inversion injury in mid December.  Overall moderately improved following conservative management  and intra-articular injection.  Patient continues to have some symptoms.  We discussed options.  At this point we will continue to give home exercise program and conservative management a bit more time.  However if not improving next step would be MRI for further evaluation and to rule out intra-articular injury such as OCD not seen on x-ray.  I have sent patient a reminder notification in 3 weeks to see me an update.  At that point if not better MRI as above.      Discussed warning signs or symptoms. Please see discharge instructions. Patient expresses understanding.   The above documentation has been reviewed and is accurate and complete Lynne Leader

## 2020-01-05 NOTE — Patient Instructions (Addendum)
Thank you for coming in today. If not better in 1 month we will plan for MRI.  COntinue home exercises  Use the brace with high risk activity and daily if needed.  If better no need for recheck.  If not better let me know and I will order MRI.

## 2020-04-22 ENCOUNTER — Other Ambulatory Visit: Payer: Self-pay | Admitting: Internal Medicine

## 2020-04-22 DIAGNOSIS — Z1231 Encounter for screening mammogram for malignant neoplasm of breast: Secondary | ICD-10-CM

## 2020-05-06 ENCOUNTER — Ambulatory Visit
Admission: RE | Admit: 2020-05-06 | Discharge: 2020-05-06 | Disposition: A | Discharge status: Home / Self Care | Payer: BC Managed Care – PPO | Source: Ambulatory Visit | Attending: Internal Medicine | Admitting: Internal Medicine

## 2020-05-06 ENCOUNTER — Other Ambulatory Visit: Payer: Self-pay

## 2020-05-06 DIAGNOSIS — Z1231 Encounter for screening mammogram for malignant neoplasm of breast: Secondary | ICD-10-CM

## 2020-05-27 ENCOUNTER — Ambulatory Visit
Admission: RE | Admit: 2020-05-27 | Discharge: 2020-05-27 | Disposition: A | Discharge status: Home / Self Care | Payer: BC Managed Care – PPO | Source: Ambulatory Visit | Attending: Internal Medicine | Admitting: Internal Medicine

## 2020-05-27 ENCOUNTER — Other Ambulatory Visit: Payer: Self-pay | Admitting: Internal Medicine

## 2020-05-27 DIAGNOSIS — M542 Cervicalgia: Secondary | ICD-10-CM

## 2020-05-27 DIAGNOSIS — M545 Low back pain, unspecified: Secondary | ICD-10-CM

## 2020-09-21 IMAGING — DX DG ANKLE COMPLETE 3+V*L*
3 series · 3 of 3 positions shown · non-contrast
Comparison: None

CLINICAL DATA: Fall on ice last week.  Left ankle pain, swelling

EXAM:
LEFT ANKLE COMPLETE - 3+ VIEW

[ankle ap]
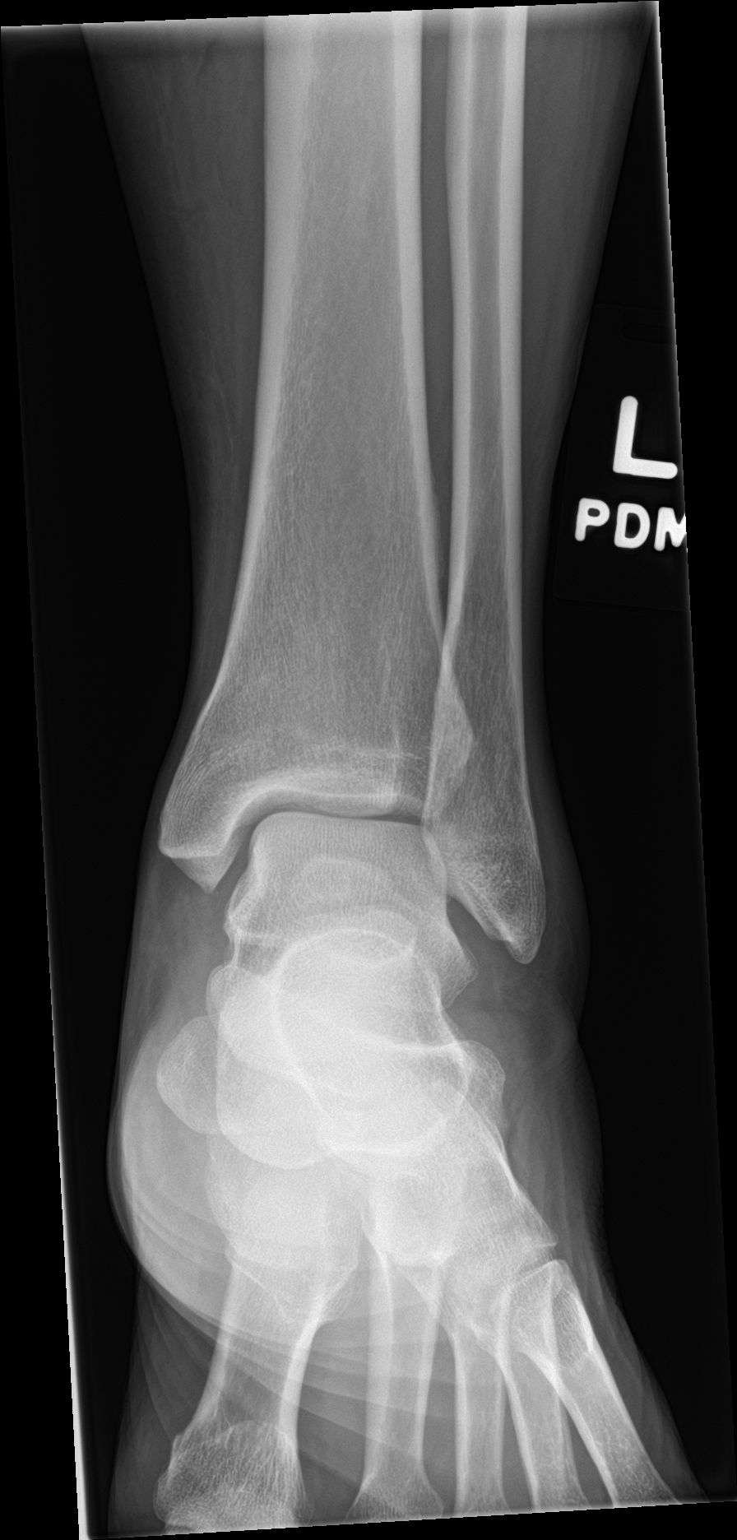

[ankle obl]
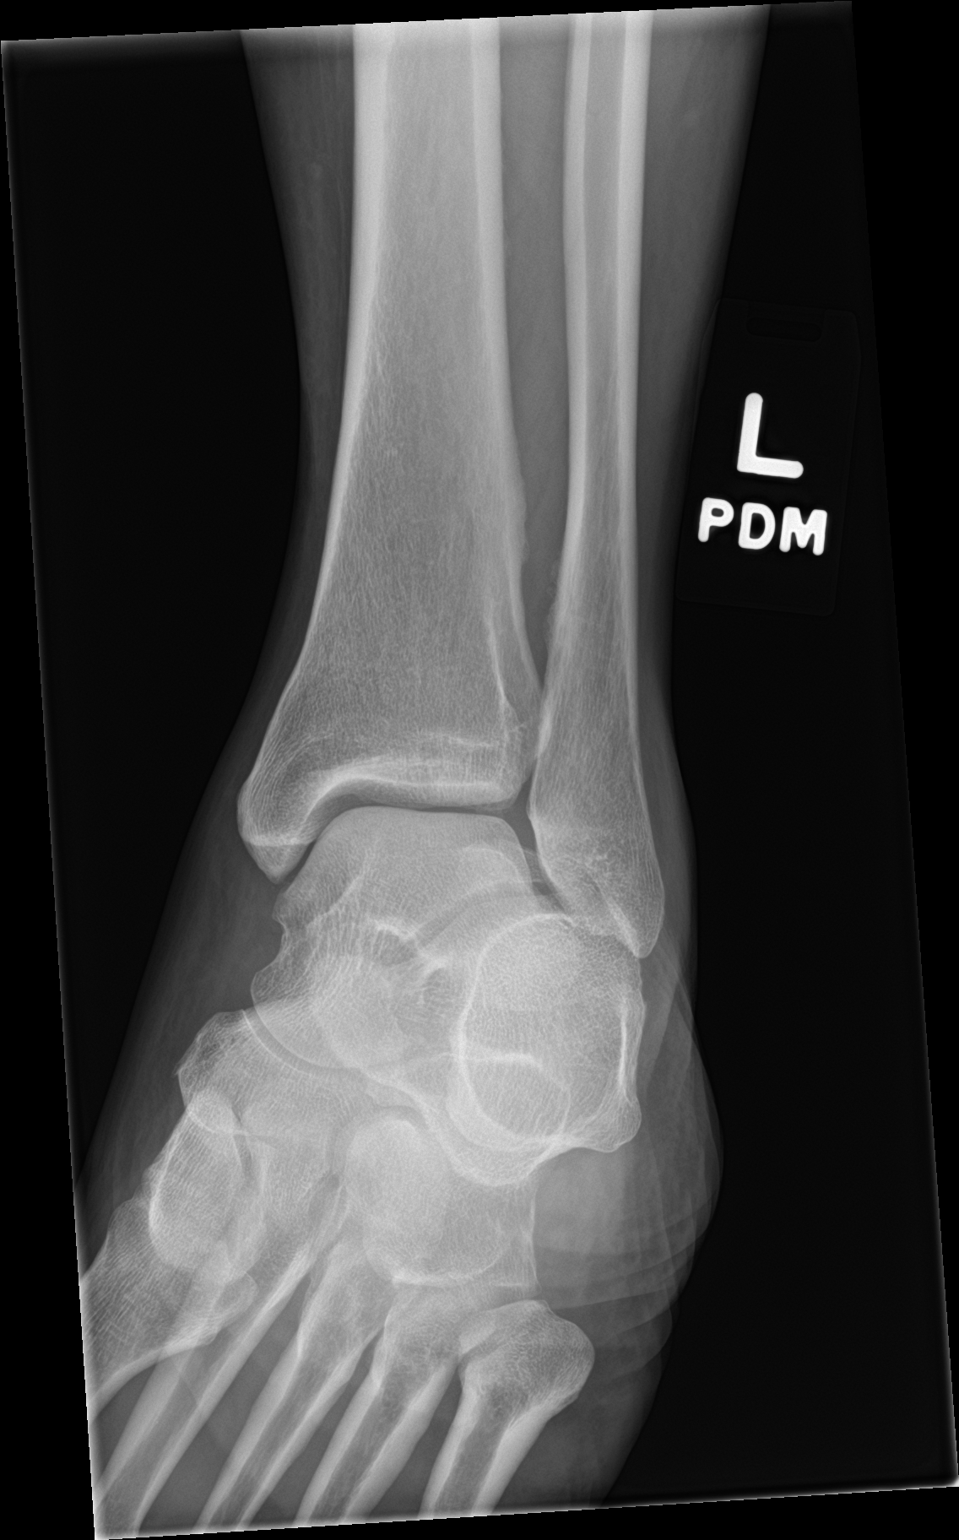

[ankle lat]
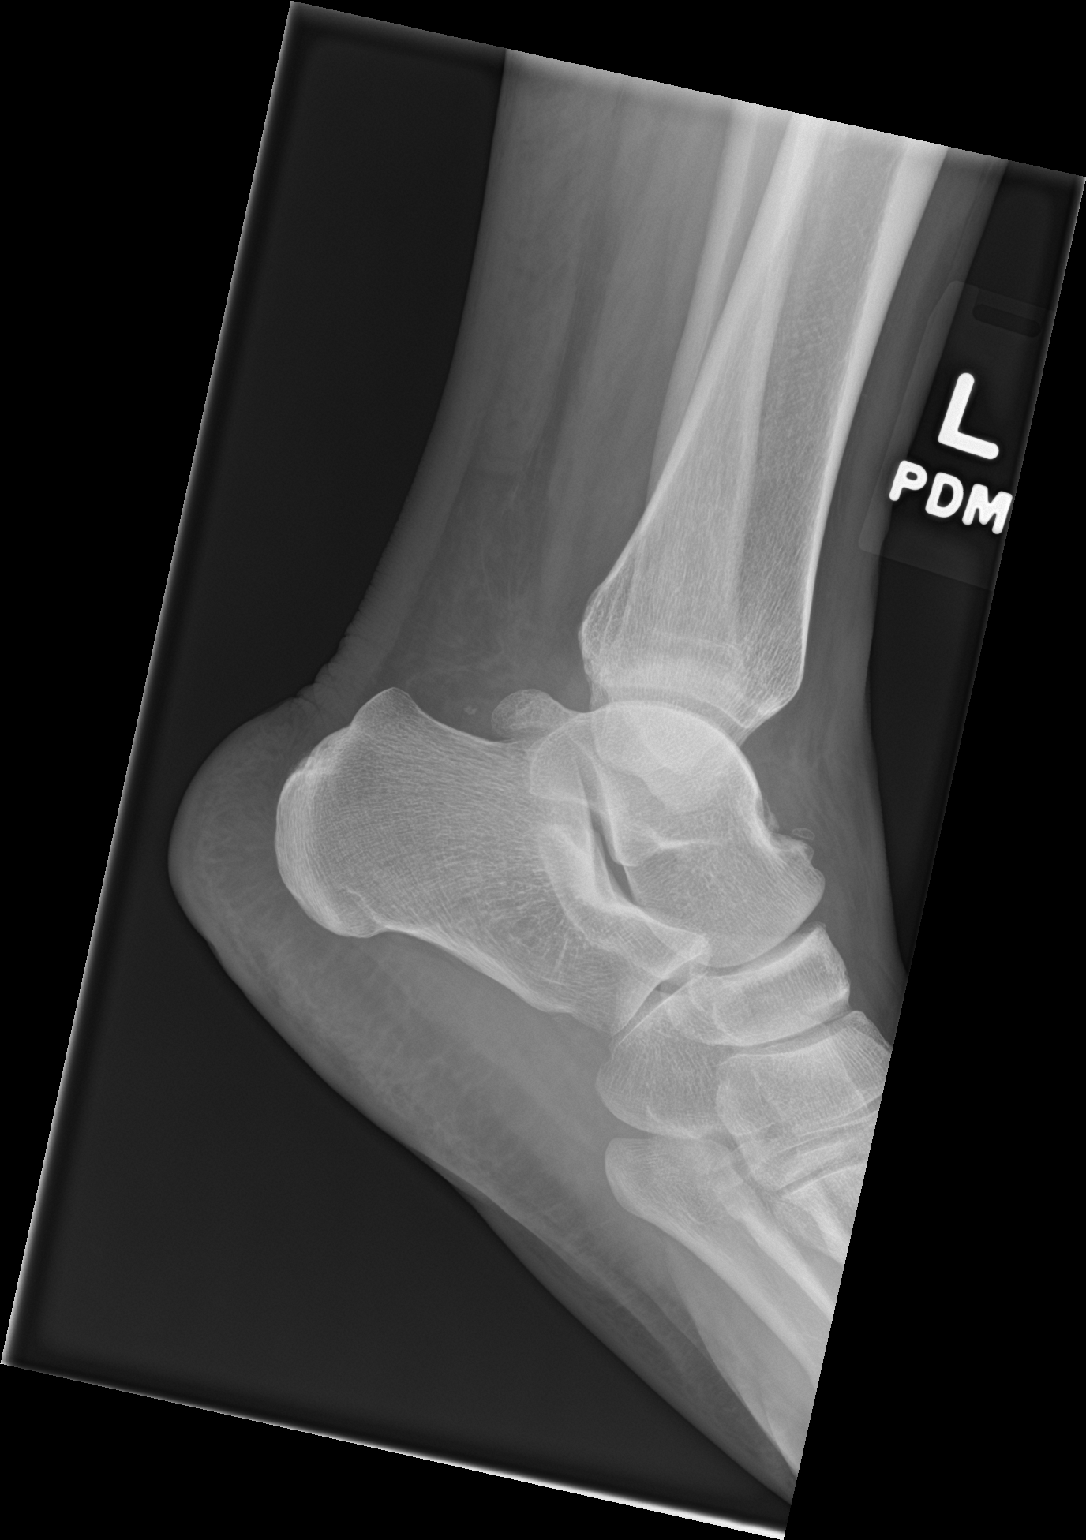

[3 of 3 positions shown; findings below may reference images not displayed]

FINDINGS: Mild lateral soft tissue swelling. No acute bony abnormality.
Specifically, no fracture, subluxation, or dislocation. Joint spaces
maintained.
IMPRESSION: No acute bony abnormality.

## 2021-08-28 ENCOUNTER — Other Ambulatory Visit: Payer: Self-pay

## 2021-08-28 ENCOUNTER — Other Ambulatory Visit: Payer: Self-pay | Admitting: Internal Medicine

## 2021-08-28 ENCOUNTER — Ambulatory Visit
Admission: RE | Admit: 2021-08-28 | Discharge: 2021-08-28 | Disposition: A | Discharge status: Home / Self Care | Payer: BC Managed Care – PPO | Source: Ambulatory Visit | Attending: Internal Medicine | Admitting: Internal Medicine

## 2021-08-28 DIAGNOSIS — M25551 Pain in right hip: Secondary | ICD-10-CM

## 2021-09-20 ENCOUNTER — Other Ambulatory Visit: Payer: Self-pay

## 2021-09-20 ENCOUNTER — Ambulatory Visit (INDEPENDENT_AMBULATORY_CARE_PROVIDER_SITE_OTHER): Payer: BC Managed Care – PPO | Admitting: Sports Medicine

## 2021-09-20 VITALS — BP 136/90 | HR 89 | Ht 64.0 in | Wt 225.0 lb

## 2021-09-20 DIAGNOSIS — G8929 Other chronic pain: Secondary | ICD-10-CM

## 2021-09-20 DIAGNOSIS — M5441 Lumbago with sciatica, right side: Secondary | ICD-10-CM

## 2021-09-20 DIAGNOSIS — G5701 Lesion of sciatic nerve, right lower limb: Secondary | ICD-10-CM

## 2021-09-20 MED ORDER — IBUPROFEN 800 MG PO TABS
800.0000 mg | ORAL_TABLET | Freq: Three times a day (TID) | ORAL | 0 refills | Status: AC
Start: 2021-09-20 — End: ?

## 2021-09-20 MED ORDER — GABAPENTIN 100 MG PO CAPS
100.0000 mg | ORAL_CAPSULE | Freq: Every day | ORAL | 0 refills | Status: DC
Start: 2021-09-20 — End: 2021-10-15

## 2021-09-20 NOTE — Progress Notes (Signed)
Melody Lewis D.[place] Phone: [phone]   Assessment and Plan:     1. Chronic right-sided low back pain with right-sided sciatica 2. Piriformis syndrome of right side -Acute on chronic, initial sports medicine visit - Unclear etiology at this time.  Underlying pathology could be from lumbar spine causing right-sided radicular symptoms versus acute flare of mild hip OA, versus piriformis syndrome.  Patient had recent lumbar MRI.  Advised patient to bring in images and results as this would help with diagnosis - Reviewed x-rays with patient.  My interpretation: Mild right hip OA with mild decrease in joint space, cortical irregularities over femoral head and acetabulum without acute fracture or dislocation.  Lumbar spine with minimal grade 1 retrolisthesis of L5 compared to L4 and facet arthropathy of L5 - Start ibuprofen 800 mg 3 times daily for 2 weeks - Start gabapentin 100 mg daily.  Patient was concerned of gabapentin side effects, so we will start on lowest dose and can increase slowly - Start HEP for sciatic and piriformis syndrome - Start physical therapy for sciatic and piriformis syndrome    Pertinent previous records reviewed include Ortho office note, hip x-ray, lumbar spine x-ray   Follow Up: In 2 weeks for reevaluation.  Would review lumbar MRI the patient is to bring in.  Further determine cause of pain and could treat with lumbar epidural versus piriformis injection versus hip CSI based on MRI results.   Subjective:    Chief Complaint: Glute pain   HPI:   09/20/21 Patient is a 60 year old female presenting with Glute pain that has been going on for    Patient locates pain to Right glute that radiates around to the right groin and down the front and inside of the right leg, and describes pain as a cramping, squeezing, aching and tight. Patient has had a hip x ray on 08/29/21 and a low back x  ray on 05/27/21. Patient states that she used to do daily walks and she is unable to do that. Patient has tried physical therapy but could not do the exercises because it hurt so bad.   Radiates: yes  Numbness/tingling: yes in the right shin Weakness: yes  Aggravates: Pushing on gas pedal, standing too long, walking  Treatments tried: Physical therapy, massages, massage gun, tens unit, heat, ice, topical creams, ibuprofen, meloxicam    Relevant Historical Information: GERD  Additional pertinent review of systems negative.   Current Outpatient Medications:    Acetylcysteine (NAC 600 PO), Take by mouth daily., Disp: , Rfl:    Ascorbic Acid (VITAMIN C) 1000 MG tablet, Take 2,000 mg by mouth daily. , Disp: , Rfl:    B Complex Vitamins (B COMPLEX 1 PO), Take by mouth daily., Disp: , Rfl:    Cholecalciferol (VITAMIN D3 PO), 5,000 Units daily. , Disp: , Rfl:    cyclobenzaprine (FLEXERIL) 10 MG tablet, cyclobenzaprine 10 mg tablet, Disp: , Rfl:    gabapentin (NEURONTIN) 100 MG capsule, Take 1 capsule (100 mg total) by mouth at bedtime., Disp: 30 capsule, Rfl: 0   ibuprofen (ADVIL) 800 MG tablet, Take 1 tablet (800 mg total) by mouth 3 (three) times daily., Disp: 42 tablet, Rfl: 0   levocetirizine (XYZAL) 5 MG tablet, TAKE 1 TABLET BY MOUTH IN THE EVENING AS NEEDED **OFFICE VISIT DUE**, Disp: 30 tablet, Rfl: 0   MAGNESIUM PO, Take by mouth. 2 at bedtime, Disp: , Rfl:  Multiple Vitamins-Minerals (ZINC PO), Take by mouth. 54 mg daily, Disp: , Rfl:    mupirocin nasal ointment (BACTROBAN) 2 %, Place 1 application into the nose 2 (two) times daily. Use one-half of tube in each nostril twice daily for five (5) days. After application, press sides of nose together and gently massage., Disp: 10 g, Rfl: 0   Probiotic Product (PROBIOTIC DAILY PO), Take by mouth. 50 billion daily, Disp: , Rfl:    vitamin E 400 UNIT capsule, Take 400 Units by mouth daily., Disp: , Rfl:    Objective:     Vitals:    09/20/21 0829  BP: 136/90  Pulse: 89  SpO2: 98%  Weight: 225 lb (102.1 kg)  Height: 5\' 4"  (1.626 m)      Body mass index is 38.62 kg/m.    Physical Exam:    General: awake, alert, and oriented no acute distress, nontoxic Skin: no suspicious lesions or rashes Neuro:sensation intact distally with no dificits, normal muscle tone, no atrophy, strength 5/5 in all tested lower ext groups Psych: normal mood and affect, speech clear  Hip: No deformity, swelling or wasting ROM Fexion 80, ext 20, IR 30, ER 40 TTP gluteal musculature, greater trochanter NTTP over the hip flexors, adductors, inguinal canal, pubic ramus,  si joint, lumbar spine Negative log roll with FROM Positive FABER with groin and gluteal pain Positive FADIR with gluteal pain   Gait favoring left leg   Electronically signed by:  Melody Lewis D.[facility] 9:21 AM 09/20/21

## 2021-09-20 NOTE — Patient Instructions (Addendum)
Good to see you  Bring in MRI pictures and results Gabapentin 100mg  once daily  Ibuprofen 800mg  three times a day for two weeks  Referral to physical therapy placed Home exercises given  See me again in 2 weeks

## 2021-10-04 ENCOUNTER — Other Ambulatory Visit: Payer: Self-pay

## 2021-10-04 ENCOUNTER — Ambulatory Visit: Payer: BC Managed Care – PPO | Attending: Sports Medicine | Admitting: Physical Therapy

## 2021-10-04 DIAGNOSIS — R2689 Other abnormalities of gait and mobility: Secondary | ICD-10-CM | POA: Insufficient documentation

## 2021-10-04 DIAGNOSIS — M5441 Lumbago with sciatica, right side: Secondary | ICD-10-CM | POA: Insufficient documentation

## 2021-10-04 DIAGNOSIS — G8929 Other chronic pain: Secondary | ICD-10-CM | POA: Insufficient documentation

## 2021-10-04 DIAGNOSIS — M25551 Pain in right hip: Secondary | ICD-10-CM | POA: Insufficient documentation

## 2021-10-04 DIAGNOSIS — G5701 Lesion of sciatic nerve, right lower limb: Secondary | ICD-10-CM | POA: Diagnosis not present

## 2021-10-04 NOTE — Therapy (Signed)
[place], [place], [address] Phone: [phone]   Fax:  [phone]  Physical Therapy Evaluation  Patient Details  Name: Melody Lewis MRN: [id number] Date of Birth: 07-30-1961 Referring Provider (PT): Glennon Mac, DO  Encounter Date: 10/04/2021   PT End of Session - 10/05/21 1122     Visit Number 1    Number of Visits 16    Date for PT Re-Evaluation 11/30/21    Authorization Type BCBS    PT Start Time 5056    PT Stop Time 9794    PT Time Calculation (min) 45 min             Past Medical History:  Diagnosis Date   ALLERGIC RHINITIS 10/18/2008   Allergy    Arthritis    shoulders bursitis   BACK PAIN, UPPER 10/18/2008   Blood transfusion without reported diagnosis    Dysuria 10/18/2010   GERD (gastroesophageal reflux disease) 06/20/2011   not current   INTERMITTENT VERTIGO 11/01/2009   KNEE PAIN, RIGHT 10/18/2008   LOW BACK PAIN 10/18/2008   LUMBAR RADICULOPATHY, RIGHT 06/28/2009   Seasonal allergies    SHOULDER PAIN, LEFT 10/18/2010   SINUSITIS- ACUTE-NOS 12/22/2009   UNSPECIFIED NEURALGIA NEURITIS AND RADICULITIS 10/18/2008   WRIST PAIN, RIGHT 10/18/2008    Past Surgical History:  Procedure Laterality Date   ABDOMINAL HYSTERECTOMY  05/2005   due to fibroids, still born, ovaries intact   [place]   COLONOSCOPY  08-04-2009   DILATION AND CURETTAGE OF UTERUS  2006   TONSILLECTOMY  1987    There were no vitals filed for this visit.    Subjective Assessment - 10/05/21 1102     Subjective Melody Lewis is a 60 y.o. female who presents to clinic with chief complaint of LBP with R radiculopathy.  MOI/History of condition: Sudden onset with no clear MOI.  Radiating pain.  Pain location: R lateral hip, radiating to groin and anterior thigh; some n/t .  Red flags: denies saddle anesthesia, denies BB changes.  48 hour pain intensity:  highest "12"/10, current 0/10, best 0/10.   Aggs: walking (5 min), standing (5 min), R rotation.  Eases: sitting, gabapentin.  Nature: dull and excruciating, squeezes.  Severity: high.  Irritability: low.  Stage: chronic.  Stability: staying the same.  24 hour pattern: NA.  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  Hobbies: walking.  Functional limitations/goals: walking, standing, housework, garden.  Home environment: lives with husband, 3 STE.  Assistive device: NA.                [facility] PT Assessment - 10/05/21 0001       Assessment   Medical Diagnosis Referral diagnosis: Chronic right-sided low back pain with right-sided sciatica (M54.41, G89.29), Piriformis syndrome of right side (G57.01)    Referring Provider (PT) Glennon Mac, DO    Onset Date/Surgical Date 04/04/21    Hand Dominance Right    Next MD Visit tomorrow    Prior Therapy Yes, minimally effective      Precautions   Precaution Comments NA      Restrictions   Other Position/Activity Restrictions NA      Balance Screen   Has the patient fallen in the past 6 months No      Prior Function   Level of Independence Independent      Observation/Other Assessments   Observations antalgic gait, reduced time in stance on R  Focus on Therapeutic Outcomes (FOTO)  next visit      Sensation   Light Touch Appears Intact   subjective report of n/t below knee     AROM   Overall AROM Comments limited in all planes by pain      Strength   Overall Strength Comments myotomes WNL      Flexibility   Hamstrings mod restriction bil    Quadriceps mod restriction bil      Palpation   SI assessment  reproduction of "pins and needles" with PA of superior sacrum    Palpation comment TTP over R glute max and piriformis with no increase in radicular pain, PA of lumbar spine WNL with no radicular pain      Special Tests   Other special tests slump and SLR (-), faber, fadir, scour (+)                        Objective measurements completed on  examination: See above findings.                PT Education - 10/05/21 1103     Education Details POC, diagnosis, prognosis, HEP, FOTO.  Pt educated via explanation, demonstration, and handout (HEP).  Pt confirms understanding verbally.              PT Short Term Goals - 10/05/21 1125       PT SHORT TERM GOAL #1   Title [name] will be >75% HEP compliant to improve carryover between sessions and facilitate independent management of condition    Target Date 10/26/21               PT Long Term Goals - 10/05/21 1127       PT LONG TERM GOAL #1   Title FOTO goal      PT LONG TERM GOAL #2   Title [name] will report >/= 50% decrease in pain from evaluation  EVAL: 10/10 max pain  target date: 11/29/21      PT LONG TERM GOAL #3   Title [name] will be able to resume yardwork and housework, not limited by pain  EVAL: limited  target date: 11/29/21      PT LONG TERM GOAL #4   Title [name] will be able to stand for 30 min while making a meal, not limited by pain  EVAL: 5 min  target date: 11/29/21                    Plan - 10/05/21 1123     Clinical Impression Statement Melody Lewis is a 60 y.o. female who presents to clinic with signs and sxs consistent with R hip and low back pain with radiating sxs.  Etiology is unclear at this point.  Possibly contribution from both the hip and lumbar.  Intraarticular hip special tests are (+), dural tension tests are (-), but pt reports radiating pain below the knee, increase in "pins and needles" with PA of sacrum, and subjective report of numbness below knee which is not present on exam.  We will proceed with strengthening of the hip and core and see if any more clear pattern occurs Pt presents with pain and impairments/deficits in: gait, strength, ROM.  Activity limitations include: steps, transfers, standing, walking.  Participation limitations include: housework, yardwork,  gardening, walking for recreation.  Pt will benefit from skilled therapy to address pain and  the listed deficits in order to achieve functional goals, enable safety and independence in completion of daily tasks, and return to PLOF.    Stability/Clinical Decision Making Evolving/Moderate complexity    Clinical Decision Making Moderate    Rehab Potential Fair    PT Frequency 2x / week    PT Duration 8 weeks    PT Treatment/Interventions ADLs/Self Care Home Management;Aquatic Therapy;Iontophoresis 4mg /ml Dexamethasone;Gait training;Therapeutic activities;Therapeutic exercise;Neuromuscular re-education;Manual techniques;Dry needling;Vasopneumatic Device;Spinal Manipulations;Joint Manipulations    PT Next Visit Plan femoral nerve tension test, take [place] trying LAD at home with husband    Consulted and Agree with Plan of Care Patient             Patient will benefit from skilled therapeutic intervention in order to improve the following deficits and impairments:  Abnormal gait, Decreased balance, Decreased endurance, Pain, Decreased strength, Decreased activity tolerance, Decreased range of motion  Visit Diagnosis: Pain in right hip - Plan: PT plan of care cert/re-cert  Chronic right-sided low back pain with right-sided sciatica - Plan: PT plan of care cert/re-cert  Other abnormalities of gait and mobility - Plan: PT plan of care cert/re-cert     Problem List Patient Active Problem List   Diagnosis Date Noted   Infected insect bite of left thigh 03/25/2018   Acute sinus infection 02/24/2016   Radiculitis of left cervical region 02/24/2016   Frozen shoulder syndrome 01/03/2016   Supraclavicular lymphadenopathy 12/26/2015   Nasal sore 12/26/2015   Right shoulder pain 03/17/2015   Cystitis 04/20/2014   Urinary frequency 03/03/2013   Stress incontinence in female 03/03/2013   Vaginal dryness, menopausal 03/03/2013   GERD (gastroesophageal reflux disease)  06/20/2011   Preventative health care 05/04/2011   LUMBAR RADICULOPATHY, RIGHT 06/28/2009   Allergic rhinitis 10/18/2008   LOW BACK PAIN 10/18/2008   BACK PAIN, UPPER 10/18/2008    Mathis Dad, PT 10/05/2021, 11:38 AM  [facility] [address] [place], [place], [address] Phone: [phone]   Fax:  [phone]  Name: KLARYSSA FAUTH MRN: [id number] Date of Birth: 04-08-1961

## 2021-10-05 ENCOUNTER — Other Ambulatory Visit: Payer: Self-pay | Admitting: Sports Medicine

## 2021-10-05 ENCOUNTER — Encounter: Payer: Self-pay | Admitting: Sports Medicine

## 2021-10-05 ENCOUNTER — Ambulatory Visit (INDEPENDENT_AMBULATORY_CARE_PROVIDER_SITE_OTHER): Payer: BC Managed Care – PPO | Admitting: Sports Medicine

## 2021-10-05 ENCOUNTER — Other Ambulatory Visit: Payer: Self-pay

## 2021-10-05 VITALS — BP 112/82 | HR 84 | Ht 64.0 in | Wt 225.0 lb

## 2021-10-05 DIAGNOSIS — M545 Low back pain, unspecified: Secondary | ICD-10-CM

## 2021-10-05 DIAGNOSIS — M47817 Spondylosis without myelopathy or radiculopathy, lumbosacral region: Secondary | ICD-10-CM | POA: Diagnosis not present

## 2021-10-05 DIAGNOSIS — M47816 Spondylosis without myelopathy or radiculopathy, lumbar region: Secondary | ICD-10-CM | POA: Diagnosis not present

## 2021-10-05 NOTE — Progress Notes (Signed)
Melody Lewis Melody Lewis [place] Phone: [phone]   Assessment and Plan:     1. Acute right-sided low back pain, unspecified whether sciatica present 2. Facet arthropathy, lumbar 3. Facet arthritis of lumbosacral region -Chronic with exacerbation, subsequent visit - Reviewed MRI of lumbar spine from 07/01/2021 with patient.  This MRI shows moderate facet arthropathy at L4-5 and L5-S1 with reactive edema.  Based on these MRI findings and patient's continued symptoms, patient elected to proceed with facet injection on right side L4-5 and L5-S1 - While awaiting injections, start meloxicam 15 mg daily x3 weeks and then may use remainder as needed for pain control - Increase to gabapentin 200 mg daily and see if this is more beneficial.  If patient no side effects then decrease back to 100 mg which she previously tolerated well - Discussed that we could trial muscle relaxers, however with MRI findings, I doubt the benefit they will have.  Patient decided to not start relaxers at today's visit    Pertinent previous records reviewed include outside report of MRI from 07/01/2021 of lumbar spine, PT note from 10/04/2021   Follow-up in 3 weeks for reevaluation, discussion of improvement on meloxicam and gabapentin, discussion of improvement after.  Patient may call for refills of meloxicam or gabapentin prior to follow-up visit and we can refill those as telephone encounter.    Subjective:   I, Melody Lewis, am serving as a scribe for Dr. Glennon Lewis  Chief Complaint: glute pain   HPI:   09/20/21 Patient is a 60 year old female presenting with Glute pain that has been going on for    Patient locates pain to Right glute that radiates around to the right groin and down the front and inside of the right leg, and describes pain as a cramping, squeezing, aching and tight. Patient has had a hip x ray on 08/29/21 and a low  back x ray on 05/27/21. Patient states that she used to do daily walks and she is unable to do that. Patient has tried physical therapy but could not do the exercises because it hurt so bad.    Radiates: yes  Numbness/tingling: yes in the right shin Weakness: yes  Aggravates: Pushing on gas pedal, standing too long, walking  Treatments tried: Physical therapy, massages, massage gun, tens unit, heat, ice, topical creams, ibuprofen, meloxicam   10/05/21 Patient states that she is feeling a bit better, the intensity of the pain has gotten better. Started the gabapentin and it helps, but when standing or walking she still has the cramping in upper R thigh with numbness running down the leg.  Relevant Historical Information: GERD  Additional pertinent review of systems negative.   Current Outpatient Medications:    Acetylcysteine (NAC 600 PO), Take by mouth daily., Disp: , Rfl:    Ascorbic Acid (VITAMIN C) 1000 MG tablet, Take 2,000 mg by mouth daily. , Disp: , Rfl:    B Complex Vitamins (B COMPLEX 1 PO), Take by mouth daily., Disp: , Rfl:    Cholecalciferol (VITAMIN D3 PO), 5,000 Units daily. , Disp: , Rfl:    cyclobenzaprine (FLEXERIL) 10 MG tablet, cyclobenzaprine 10 mg tablet, Disp: , Rfl:    gabapentin (NEURONTIN) 100 MG capsule, Take 1 capsule (100 mg total) by mouth at bedtime., Disp: 30 capsule, Rfl: 0   ibuprofen (ADVIL) 800 MG tablet, Take 1 tablet (800 mg total) by mouth 3 (three) times daily., Disp:  42 tablet, Rfl: 0   levocetirizine (XYZAL) 5 MG tablet, TAKE 1 TABLET BY MOUTH IN THE EVENING AS NEEDED **OFFICE VISIT DUE**, Disp: 30 tablet, Rfl: 0   MAGNESIUM PO, Take by mouth. 2 at bedtime, Disp: , Rfl:    Multiple Vitamins-Minerals (ZINC PO), Take by mouth. 54 mg daily, Disp: , Rfl:    mupirocin nasal ointment (BACTROBAN) 2 %, Place 1 application into the nose 2 (two) times daily. Use one-half of tube in each nostril twice daily for five (5) days. After application, press sides of  nose together and gently massage., Disp: 10 g, Rfl: 0   Probiotic Product (PROBIOTIC DAILY PO), Take by mouth. 50 billion daily, Disp: , Rfl:    vitamin E 400 UNIT capsule, Take 400 Units by mouth daily., Disp: , Rfl:    Objective:     Vitals:   10/05/21 0846  BP: 112/82  Pulse: 84  SpO2: 98%  Weight: 225 lb (102.1 kg)  Height: 5\' 4"  (1.626 m)      Body mass index is 38.62 kg/m.    Physical Exam:    General: awake, alert, and oriented no acute distress, nontoxic Skin: no suspicious lesions or rashes Neuro:sensation intact distally with no dificits, normal muscle tone, no atrophy, strength 5/5 in all tested lower ext groups Psych: normal mood and affect, speech clear   Right Hip: No deformity, swelling or wasting ROM Fexion 80, ext 20, IR 30, ER 40 TTP gluteal musculature, greater trochanter NTTP over the hip flexors, adductors, inguinal canal, pubic ramus,  si joint, lumbar spine Negative log roll with FROM Positive FABER with groin and gluteal pain Positive FADIR with gluteal pain     Gait favoring left leg   Electronically signed by:  Melody Lewis Melody [facility] 10:11 AM 10/05/21

## 2021-10-05 NOTE — Patient Instructions (Addendum)
Good to see you  Start meloxicam 15mg  daily for 3 weeks call when needing a refill Increase gabapentin to 200mg  at night Continue home exercises Facet injection L4-5 L5 S1 right side call [facility] to schedule their number is [phone] See me again in 3 weeks follow up

## 2021-10-06 ENCOUNTER — Ambulatory Visit: Payer: BC Managed Care – PPO | Admitting: Sports Medicine

## 2021-10-12 ENCOUNTER — Other Ambulatory Visit: Payer: Self-pay | Admitting: Sports Medicine

## 2021-10-13 ENCOUNTER — Ambulatory Visit
Admission: RE | Admit: 2021-10-13 | Discharge: 2021-10-13 | Disposition: A | Discharge status: Home / Self Care | Payer: BC Managed Care – PPO | Source: Ambulatory Visit | Attending: Sports Medicine | Admitting: Sports Medicine

## 2021-10-13 ENCOUNTER — Other Ambulatory Visit: Payer: Self-pay

## 2021-10-13 DIAGNOSIS — M545 Low back pain, unspecified: Secondary | ICD-10-CM

## 2021-10-13 MED ORDER — IOPAMIDOL (ISOVUE-M 200) INJECTION 41%
1.0000 mL | Freq: Once | INTRAMUSCULAR | Status: AC
  Administered 2021-10-13: 1 mL via EPIDURAL

## 2021-10-13 MED ORDER — METHYLPREDNISOLONE ACETATE 40 MG/ML INJ SUSP (RADIOLOG
80.0000 mg | Freq: Once | INTRAMUSCULAR | Status: AC
  Administered 2021-10-13: 80 mg via EPIDURAL

## 2021-10-13 NOTE — Discharge Instructions (Signed)

## 2021-10-14 ENCOUNTER — Other Ambulatory Visit: Payer: Self-pay | Admitting: Sports Medicine

## 2021-10-14 ENCOUNTER — Encounter: Payer: BC Managed Care – PPO | Admitting: Physical Therapy

## 2021-10-16 ENCOUNTER — Telehealth: Payer: Self-pay | Admitting: Internal Medicine

## 2021-10-16 NOTE — Telephone Encounter (Signed)
Pt. [place] on 11.5.2022.   Caller states she needs a refill on her prescription for gabapentin. States no new or worsening symptoms at this time.   1.Medication Requested: gabapentin (NEURONTIN) 100 MG capsule  2. Pharmacy (Name, [facility]): [place], [place] Phone:  [phone]  Fax:  [phone]     3. On Med List: Y

## 2021-10-24 ENCOUNTER — Ambulatory Visit: Payer: BC Managed Care – PPO | Admitting: Physical Therapy

## 2021-10-26 ENCOUNTER — Other Ambulatory Visit: Payer: Self-pay

## 2021-10-26 ENCOUNTER — Ambulatory Visit: Payer: BC Managed Care – PPO | Admitting: Sports Medicine

## 2021-10-26 ENCOUNTER — Ambulatory Visit: Payer: BC Managed Care – PPO | Attending: Sports Medicine

## 2021-10-26 DIAGNOSIS — G8929 Other chronic pain: Secondary | ICD-10-CM | POA: Diagnosis present

## 2021-10-26 DIAGNOSIS — M25551 Pain in right hip: Secondary | ICD-10-CM | POA: Insufficient documentation

## 2021-10-26 DIAGNOSIS — R2689 Other abnormalities of gait and mobility: Secondary | ICD-10-CM | POA: Insufficient documentation

## 2021-10-26 DIAGNOSIS — M5441 Lumbago with sciatica, right side: Secondary | ICD-10-CM | POA: Diagnosis present

## 2021-10-26 NOTE — Progress Notes (Deleted)
Melody Lewis D.Melody Lewis Phone: [phone]   Assessment and Plan:     There are no diagnoses linked to this encounter.  *** - Patient has received significant relief with OMT in the past.  Elects for repeat OMT today.  Tolerated well per note below. - Decision today to treat with OMT was based on Physical Exam   After verbal consent patient was treated with HVLA (high velocity low amplitude), ME (muscle energy), FPR (flex positional release), ST (soft tissue), PC/PD (Pelvic Compression/ Pelvic Decompression) techniques in cervical, rib, thoracic, lumbar, and pelvic areas. Patient tolerated the procedure well with improvement in symptoms.  Patient educated on potential side effects of soreness and recommended to rest, hydrate, and use Tylenol as needed for pain control.   Pertinent previous records reviewed include ***   Follow Up: ***     Subjective:   I, Melody Lombard, PhD, LAT, ATC, am acting as a Education administrator for Melody [employer], DO, CAQSM.  Chief Complaint: R-sided LBP  HPI:  10/05/21 Patient states that she is feeling a bit better, the intensity of the pain has gotten better. Started the gabapentin and it helps, but when standing or walking she still has the cramping in upper R thigh with numbness running down the leg.  10/26/21 Pt was last seen by Melody Lewis on 10/05/21 and was referred for a facet injection which was performed on 10/13/21. Pt was also prescribed Meloxicam and increased her Gabapentin dosage to 200mg . Today, pt reports  Relevant Historical Information: ***  Additional pertinent review of systems negative.  Current Outpatient Medications  Medication Sig Dispense Refill   Acetylcysteine (NAC 600 PO) Take by mouth daily.     Ascorbic Acid (VITAMIN C) 1000 MG tablet Take 2,000 mg by mouth daily.      B Complex Vitamins (B COMPLEX 1 PO) Take by mouth daily.     Cholecalciferol (VITAMIN D3 PO) 5,000  Units daily.      cyclobenzaprine (FLEXERIL) 10 MG tablet cyclobenzaprine 10 mg tablet     gabapentin (NEURONTIN) 100 MG capsule Take 1 capsule by mouth at bedtime 30 capsule 0   ibuprofen (ADVIL) 800 MG tablet Take 1 tablet (800 mg total) by mouth 3 (three) times daily. 42 tablet 0   levocetirizine (XYZAL) 5 MG tablet TAKE 1 TABLET BY MOUTH IN THE EVENING AS NEEDED **OFFICE VISIT DUE** 30 tablet 0   MAGNESIUM PO Take by mouth. 2 at bedtime     Multiple Vitamins-Minerals (ZINC PO) Take by mouth. 54 mg daily     mupirocin nasal ointment (BACTROBAN) 2 % Place 1 application into the nose 2 (two) times daily. Use one-half of tube in each nostril twice daily for five (5) days. After application, press sides of nose together and gently massage. 10 g 0   Probiotic Product (PROBIOTIC DAILY PO) Take by mouth. 50 billion daily     vitamin E 400 UNIT capsule Take 400 Units by mouth daily.     No current facility-administered medications for this visit.      Objective:     There were no vitals filed for this visit.    There is no height or weight on file to calculate BMI.    Physical Exam:     General: Well-appearing, cooperative, sitting comfortably in no acute distress.   OMT Physical Exam:  ASIS Compression Test: Positive Right Cervical: TTP paraspinal, *** Rib: Bilateral elevated first rib with  TTP Thoracic: TTP paraspinal,*** Lumbar: TTP paraspinal,*** Pelvis: Right anterior innominate  Electronically signed by:  Melody Lewis [facility] 7:51 AM 10/26/21

## 2021-10-26 NOTE — Therapy (Signed)
[place], [place], [address] Phone: [phone]   Fax:  [phone]  Physical Therapy Treatment  Patient Details  Name: Melody Lewis MRN: [id number] Date of Birth: 28-Jan-1961 Referring Provider (PT): Glennon Mac, DO   Encounter Date: 10/26/2021     Past Medical History:  Diagnosis Date   ALLERGIC RHINITIS 10/18/2008   Allergy    Arthritis    shoulders bursitis   BACK PAIN, UPPER 10/18/2008   Blood transfusion without reported diagnosis    Dysuria 10/18/2010   GERD (gastroesophageal reflux disease) 06/20/2011   not current   INTERMITTENT VERTIGO 11/01/2009   KNEE PAIN, RIGHT 10/18/2008   LOW BACK PAIN 10/18/2008   LUMBAR RADICULOPATHY, RIGHT 06/28/2009   Seasonal allergies    SHOULDER PAIN, LEFT 10/18/2010   SINUSITIS- ACUTE-NOS 12/22/2009   UNSPECIFIED NEURALGIA NEURITIS AND RADICULITIS 10/18/2008   WRIST PAIN, RIGHT 10/18/2008    Past Surgical History:  Procedure Laterality Date   ABDOMINAL HYSTERECTOMY  05/2005   due to fibroids, still born, ovaries intact   [place]  08-04-2009   DILATION AND CURETTAGE OF UTERUS  2006   TONSILLECTOMY  1987    There were no vitals filed for this visit.   Subjective Assessment - 10/30/21 0732     Subjective Pt presents to PT with reports of no current LBP after recent injections, does have continued reports of numbness in distal R anterior LE. Pt is ready to begin PT treatment at this time.    Currently in Pain? No/denies            [facility] Adult PT Treatment/Exercise:   Therapeutic Exercise:  Seated sciatic nerve glide 2x15 RLE POE 2x60" Prone quad stretch 2x30" R Bridge 3x10- 3"   Therapeutic Activity: Assessment of special tests and outcomes (FOTO)                                PT Short Term Goals - 10/05/21 1125       PT SHORT TERM GOAL #1   Title [name] will be >75% HEP  compliant to improve carryover between sessions and facilitate independent management of condition    Target Date 10/26/21               PT Long Term Goals - 10/05/21 1127       PT LONG TERM GOAL #1   Title FOTO goal      PT LONG TERM GOAL #2   Title [name] will report >/= 50% decrease in pain from evaluation  EVAL: 10/10 max pain  target date: 11/29/21      PT LONG TERM GOAL #3   Title [name] will be able to resume yardwork and housework, not limited by pain  EVAL: limited  target date: 11/29/21      PT LONG TERM GOAL #4   Title [name] will be able to stand for 30 min while making a meal, not limited by pain  EVAL: 5 min  target date: 11/29/21                   Plan - 10/30/21 0732     Clinical Impression Statement Pt was able to complete prescribed exercises with no adverse effect or increase in pain. She responded well to PT interventions today, noting decreased paresthesias post session. She  demonstrated preference for lumbar extension with centralization of symptoms, as well as exercises designed to decrease neural tension. HEP updated to reflect these biases, with pt continuing to benefit from skilled PT services.    PT Treatment/Interventions ADLs/Self Care Home Management;Aquatic Therapy;Iontophoresis 4mg /ml Dexamethasone;Gait training;Therapeutic activities;Therapeutic exercise;Neuromuscular re-education;Manual techniques;Dry needling;Vasopneumatic Device;Spinal Manipulations;Joint Manipulations    PT Next Visit Plan assess response to HEP; progress as able    PT Home Exercise Plan LAD at home with husband;  [facility]              Patient will benefit from skilled therapeutic intervention in order to improve the following deficits and impairments:  Abnormal gait, Decreased balance, Decreased endurance, Pain, Decreased strength, Decreased activity tolerance, Decreased range of motion  Visit Diagnosis: Pain in right  hip  Chronic right-sided low back pain with right-sided sciatica  Other abnormalities of gait and mobility     Problem List Patient Active Problem List   Diagnosis Date Noted   Infected insect bite of left thigh 03/25/2018   Acute sinus infection 02/24/2016   Radiculitis of left cervical region 02/24/2016   Frozen shoulder syndrome 01/03/2016   Supraclavicular lymphadenopathy 12/26/2015   Nasal sore 12/26/2015   Right shoulder pain 03/17/2015   Cystitis 04/20/2014   Urinary frequency 03/03/2013   Stress incontinence in female 03/03/2013   Vaginal dryness, menopausal 03/03/2013   GERD (gastroesophageal reflux disease) 06/20/2011   Preventative health care 05/04/2011   LUMBAR RADICULOPATHY, RIGHT 06/28/2009   Allergic rhinitis 10/18/2008   LOW BACK PAIN 10/18/2008   BACK PAIN, UPPER 10/18/2008    Ward Chatters, PT 10/30/2021, 7:33 AM  [facility] [address] [place], [place], [address] Phone: [phone]   Fax:  [phone]  Name: Melody Lewis MRN: [id number] Date of Birth: [birth year]

## 2021-10-31 ENCOUNTER — Ambulatory Visit: Payer: BC Managed Care – PPO

## 2021-10-31 ENCOUNTER — Other Ambulatory Visit: Payer: Self-pay

## 2021-10-31 DIAGNOSIS — M25551 Pain in right hip: Secondary | ICD-10-CM | POA: Diagnosis not present

## 2021-10-31 DIAGNOSIS — R2689 Other abnormalities of gait and mobility: Secondary | ICD-10-CM

## 2021-10-31 DIAGNOSIS — G8929 Other chronic pain: Secondary | ICD-10-CM

## 2021-10-31 DIAGNOSIS — M5441 Lumbago with sciatica, right side: Secondary | ICD-10-CM

## 2021-10-31 NOTE — Therapy (Signed)
[place], [place], [address] Phone: [phone]   Fax:  [phone]  Physical Therapy Treatment  Patient Details  Name: Melody Lewis MRN: [id number] Date of Birth: May 12, 1961 Referring Provider (PT): Glennon Mac, DO   Encounter Date: 10/31/2021   PT End of Session - 10/31/21 1841     Visit Number 3    Number of Visits 16    Date for PT Re-Evaluation 11/30/21    Authorization Type BCBS    PT Start Time 5170   arrived late   PT Stop Time 1911    PT Time Calculation (min) 31 min             Past Medical History:  Diagnosis Date   ALLERGIC RHINITIS 10/18/2008   Allergy    Arthritis    shoulders bursitis   BACK PAIN, UPPER 10/18/2008   Blood transfusion without reported diagnosis    Dysuria 10/18/2010   GERD (gastroesophageal reflux disease) 06/20/2011   not current   INTERMITTENT VERTIGO 11/01/2009   KNEE PAIN, RIGHT 10/18/2008   LOW BACK PAIN 10/18/2008   LUMBAR RADICULOPATHY, RIGHT 06/28/2009   Seasonal allergies    SHOULDER PAIN, LEFT 10/18/2010   SINUSITIS- ACUTE-NOS 12/22/2009   UNSPECIFIED NEURALGIA NEURITIS AND RADICULITIS 10/18/2008   WRIST PAIN, RIGHT 10/18/2008    Past Surgical History:  Procedure Laterality Date   ABDOMINAL HYSTERECTOMY  05/2005   due to fibroids, still born, ovaries intact   [place]   COLONOSCOPY  08-04-2009   DILATION AND CURETTAGE OF UTERUS  2006   TONSILLECTOMY  1987    There were no vitals filed for this visit.   Subjective Assessment - 10/31/21 1841     Subjective Pt presents to PT with reports of no current LBP, also denies N/T down R anterior thigh. Has been compliant with HEP with no adverse effect, notes that they decrease D/T down R LE. Is ready to begin PT at this time.    Currently in Pain? No/denies           [facility] Adult PT Treatment/Exercise:   Therapeutic Exercise:  NuStep lvl 7 UE/LE x 4 min  Supine sciatic nerve  glide 2x10 RLE POE x 60" Prone press up 2x10 Prone quad stretch 2x30" R Bridge 3x10- 3"  Standing hip ext 2x10 ea Standing mini squat UE support 2x10  Manual Therapy: N/A   Neuromuscular re-ed: N/A   Therapeutic Activity: N/A   Modalities: N/A   Self Care: N/A   Consider / progression for next session:                                PT Short Term Goals - 10/05/21 1125       PT SHORT TERM GOAL #1   Title [name] will be >75% HEP compliant to improve carryover between sessions and facilitate independent management of condition    Target Date 10/26/21               PT Long Term Goals - 10/05/21 1127       PT LONG TERM GOAL #1   Title FOTO goal      PT LONG TERM GOAL #2   Title [name] will report >/= 50% decrease in pain from evaluation  EVAL: 10/10 max pain  target date: 11/29/21      PT LONG TERM GOAL #  [place] will be able to resume yardwork and housework, not limited by pain  EVAL: limited  target date: 11/29/21      PT LONG TERM GOAL #4   Title [name] will be able to stand for 30 min while making a meal, not limited by pain  EVAL: 5 min  target date: 11/29/21                   Plan - 10/31/21 1911     Clinical Impression Statement Pt was able to complete all prescribed exercises with no adverse effect. Today's therapy session focused on continued extension bias and posterior chain strengthening. She continues to get good relief of symptoms with therapy and will continue to benefit from skilled services. PT will continue to progress as tolerated per POC.    PT Treatment/Interventions ADLs/Self Care Home Management;Aquatic Therapy;Iontophoresis 4mg /ml Dexamethasone;Gait training;Therapeutic activities;Therapeutic exercise;Neuromuscular re-education;Manual techniques;Dry needling;Vasopneumatic Device;Spinal Manipulations;Joint Manipulations    PT Next Visit Plan assess  response to HEP; progress extension-based and posterior strengthening as able    PT Home Exercise Plan LAD at home with husband;  [facility]             Patient will benefit from skilled therapeutic intervention in order to improve the following deficits and impairments:  Abnormal gait, Decreased balance, Decreased endurance, Pain, Decreased strength, Decreased activity tolerance, Decreased range of motion  Visit Diagnosis: Pain in right hip  Chronic right-sided low back pain with right-sided sciatica  Other abnormalities of gait and mobility     Problem List Patient Active Problem List   Diagnosis Date Noted   Infected insect bite of left thigh 03/25/2018   Acute sinus infection 02/24/2016   Radiculitis of left cervical region 02/24/2016   Frozen shoulder syndrome 01/03/2016   Supraclavicular lymphadenopathy 12/26/2015   Nasal sore 12/26/2015   Right shoulder pain 03/17/2015   Cystitis 04/20/2014   Urinary frequency 03/03/2013   Stress incontinence in female 03/03/2013   Vaginal dryness, menopausal 03/03/2013   GERD (gastroesophageal reflux disease) 06/20/2011   Preventative health care 05/04/2011   LUMBAR RADICULOPATHY, RIGHT 06/28/2009   Allergic rhinitis 10/18/2008   LOW BACK PAIN 10/18/2008   BACK PAIN, UPPER 10/18/2008    Ward Chatters, PT 10/31/2021, 7:14 PM  [facility] [address] [place], [place], [address] Phone: [phone]   Fax:  [phone]  Name: Melody Lewis MRN: [id number] Date of Birth: December 13, 1960

## 2021-11-07 ENCOUNTER — Encounter: Payer: Self-pay | Admitting: Physical Therapy

## 2021-11-07 ENCOUNTER — Other Ambulatory Visit: Payer: Self-pay

## 2021-11-07 ENCOUNTER — Ambulatory Visit: Payer: BC Managed Care – PPO | Admitting: Physical Therapy

## 2021-11-07 DIAGNOSIS — M25551 Pain in right hip: Secondary | ICD-10-CM | POA: Diagnosis not present

## 2021-11-07 DIAGNOSIS — M5441 Lumbago with sciatica, right side: Secondary | ICD-10-CM

## 2021-11-07 DIAGNOSIS — G8929 Other chronic pain: Secondary | ICD-10-CM

## 2021-11-07 DIAGNOSIS — R2689 Other abnormalities of gait and mobility: Secondary | ICD-10-CM

## 2021-11-07 NOTE — Therapy (Signed)
[place], [place], [address] Phone: [phone]   Fax:  [phone]  Physical Therapy Treatment  Patient Details  Name: Melody Lewis MRN: [id number] Date of Birth: 1961-06-22 Referring Provider (PT): Glennon Mac, DO   Encounter Date: 11/07/2021   PT End of Session - 11/07/21 1828     Visit Number 4    Number of Visits 16    Date for PT Re-Evaluation 11/30/21    Authorization Type BCBS    PT Start Time 7169    PT Stop Time 1905    PT Time Calculation (min) 40 min             Past Medical History:  Diagnosis Date   ALLERGIC RHINITIS 10/18/2008   Allergy    Arthritis    shoulders bursitis   BACK PAIN, UPPER 10/18/2008   Blood transfusion without reported diagnosis    Dysuria 10/18/2010   GERD (gastroesophageal reflux disease) 06/20/2011   not current   INTERMITTENT VERTIGO 11/01/2009   KNEE PAIN, RIGHT 10/18/2008   LOW BACK PAIN 10/18/2008   LUMBAR RADICULOPATHY, RIGHT 06/28/2009   Seasonal allergies    SHOULDER PAIN, LEFT 10/18/2010   SINUSITIS- ACUTE-NOS 12/22/2009   UNSPECIFIED NEURALGIA NEURITIS AND RADICULITIS 10/18/2008   WRIST PAIN, RIGHT 10/18/2008    Past Surgical History:  Procedure Laterality Date   ABDOMINAL HYSTERECTOMY  05/2005   due to fibroids, still born, ovaries intact   [place]   COLONOSCOPY  08-04-2009   DILATION AND CURETTAGE OF UTERUS  2006   TONSILLECTOMY  1987    There were no vitals filed for this visit.   Subjective Assessment - 11/07/21 1829     Subjective Pt reports that she has been doing much better.  Her pain is significantly reduced and she is having minimal R anterior thight n/t.  0/10 pain currently.              [place] Adult PT Treatment/Exercise:   Therapeutic Exercise:  NuStep lvl 7 UE/LE x 4 min  Supine sciatic nerve glide 20x RLE Modified thomas stretch with RF - 45'' x3 Prone press up 2x10 Prone quad stretch 2x30" R  (NT) Bridge 10'' x10 Alternating clam - RTB - 2x10 ea Hip adduction squeeze - 10''x10 Isometric abdominal contraction with pball - 2'' x10  Manual therapy, concentrating on increasing extensibility of restricted tissue to reduce discomfort and improve mechanics in functional movement:  - CPA lumbar spine, pt in prone, GIII-IV     PT Short Term Goals - 10/05/21 1125       PT SHORT TERM GOAL #1   Title Melody Lewis will be >75% HEP compliant to improve carryover between sessions and facilitate independent management of condition    Target Date 10/26/21               PT Long Term Goals - 10/05/21 1127       PT LONG TERM GOAL #1   Title FOTO goal      PT LONG TERM GOAL #2   Title Melody Lewis will report >/= 50% decrease in pain from evaluation  EVAL: 10/10 max pain  target date: 11/29/21      PT LONG TERM GOAL #3   Title Melody Lewis will be able to resume yardwork and housework, not limited by pain  EVAL: limited  target date: 11/29/21      PT LONG TERM GOAL #4  Title Melody Lewis will be able to stand for 30 min while making a meal, not limited by pain  EVAL: 5 min  target date: 11/29/21                   Plan - 11/08/21 7564     Clinical Impression Statement Overall, Melody Lewis is progressing well with therapy.  Pt reports no increase in baseline pain following therapy.  Today we concentrated on core strengthening and lumbar mobility.  Pt seems to be responding well to ext based program.  We will progress core and hip strength as appropriate.  Pt will continue to benefit from skilled physical therapy to address remaining deficits and achieve listed goals.  Continue per POC.    PT Treatment/Interventions ADLs/Self Care Home Management;Aquatic Therapy;Iontophoresis 4mg /ml Dexamethasone;Gait training;Therapeutic activities;Therapeutic exercise;Neuromuscular re-education;Manual techniques;Dry needling;Vasopneumatic Device;Spinal Manipulations;Joint  Manipulations    PT Next Visit Plan assess response to HEP; progress extension-based and posterior strengthening as able    PT Home Exercise Plan LAD at home with husband;  [facility]             Patient will benefit from skilled therapeutic intervention in order to improve the following deficits and impairments:  Abnormal gait, Decreased balance, Decreased endurance, Pain, Decreased strength, Decreased activity tolerance, Decreased range of motion  Visit Diagnosis: Pain in right hip  Chronic right-sided low back pain with right-sided sciatica  Other abnormalities of gait and mobility     Problem List Patient Active Problem List   Diagnosis Date Noted   Infected insect bite of left thigh 03/25/2018   Acute sinus infection 02/24/2016   Radiculitis of left cervical region 02/24/2016   Frozen shoulder syndrome 01/03/2016   Supraclavicular lymphadenopathy 12/26/2015   Nasal sore 12/26/2015   Right shoulder pain 03/17/2015   Cystitis 04/20/2014   Urinary frequency 03/03/2013   Stress incontinence in female 03/03/2013   Vaginal dryness, menopausal 03/03/2013   GERD (gastroesophageal reflux disease) 06/20/2011   Preventative health care 05/04/2011   LUMBAR RADICULOPATHY, RIGHT 06/28/2009   Allergic rhinitis 10/18/2008   LOW BACK PAIN 10/18/2008   BACK PAIN, UPPER 10/18/2008    Mathis Dad, PT 11/08/2021, 9:22 AM  [facility] [address] [place], [place], [address] Phone: [phone]   Fax:  [phone]  Name: Melody Lewis MRN: [id number] Date of Birth: 01-09-1961

## 2021-11-09 ENCOUNTER — Encounter: Payer: BC Managed Care – PPO | Admitting: Physical Therapy

## 2021-11-13 ENCOUNTER — Other Ambulatory Visit: Payer: Self-pay

## 2021-11-13 ENCOUNTER — Ambulatory Visit (INDEPENDENT_AMBULATORY_CARE_PROVIDER_SITE_OTHER): Payer: BC Managed Care – PPO | Admitting: Sports Medicine

## 2021-11-13 VITALS — BP 118/82 | HR 84 | Ht 64.0 in | Wt 233.0 lb

## 2021-11-13 DIAGNOSIS — G8929 Other chronic pain: Secondary | ICD-10-CM

## 2021-11-13 DIAGNOSIS — M5441 Lumbago with sciatica, right side: Secondary | ICD-10-CM

## 2021-11-13 DIAGNOSIS — M47816 Spondylosis without myelopathy or radiculopathy, lumbar region: Secondary | ICD-10-CM

## 2021-11-13 DIAGNOSIS — M47817 Spondylosis without myelopathy or radiculopathy, lumbosacral region: Secondary | ICD-10-CM

## 2021-11-13 NOTE — Patient Instructions (Addendum)
Good to see you  Work note to restart no restrictions  As needed Follow up

## 2021-11-13 NOTE — Progress Notes (Signed)
Melody Lewis D.[place] Phone: [phone]   Assessment and Plan:     1. Chronic right-sided low back pain with right-sided sciatica 2. Facet arthropathy, lumbar 3. Facet arthritis of lumbosacral region -Chronic with exacerbation, subsequent visit - Significant improvement in recent flare of chronic low back pain after facet injections, PT, gabapentin use - Continue physical therapy and HEP - Patient is weaning off of gabapentin.  Currently taking 200 mg nightly x2 days and then decreasing to 100 mg nightly x2 nights, and then discontinuing.  Patient can adjust gabapentin from 200 mg to 0 nightly to find which dose works best for her and may continue long-term on that dose - Discontinue meloxicam   Pertinent previous records reviewed include physical therapy notes x3, facet injection no   Follow Up: As needed if flare of pain.  We can schedule repeat facet injections at 3+ months out or retrial meloxicam/increasing gabapentin.   Subjective:    Chief Complaint: right sided low back pain   HPI:   09/20/21 Patient is a 60 year old female presenting with Glute pain that has been going on for    Patient locates pain to Right glute that radiates around to the right groin and down the front and inside of the right leg, and describes pain as a cramping, squeezing, aching and tight. Patient has had a hip x ray on 08/29/21 and a low back x ray on 05/27/21. Patient states that she used to do daily walks and she is unable to do that. Patient has tried physical therapy but could not do the exercises because it hurt so bad.    Radiates: yes  Numbness/tingling: yes in the right shin Weakness: yes  Aggravates: Pushing on gas pedal, standing too long, walking  Treatments tried: Physical therapy, massages, massage gun, tens unit, heat, ice, topical creams, ibuprofen, meloxicam    10/05/21 Patient states that she is  feeling a bit better, the intensity of the pain has gotten better. Started the gabapentin and it helps, but when standing or walking she still has the cramping in upper R thigh with numbness running down the leg.  11/13/21 Patient states that she is doing a whole lot better after the two injections , they have helped along with the PT. No numbness , some times gets a twinge on the R side  Relevant Historical Information: GERD  Additional pertinent review of systems negative.   Current Outpatient Medications:    Acetylcysteine (NAC 600 PO), Take by mouth daily., Disp: , Rfl:    Ascorbic Acid (VITAMIN C) 1000 MG tablet, Take 2,000 mg by mouth daily. , Disp: , Rfl:    B Complex Vitamins (B COMPLEX 1 PO), Take by mouth daily., Disp: , Rfl:    Cholecalciferol (VITAMIN D3 PO), 5,000 Units daily. , Disp: , Rfl:    cyclobenzaprine (FLEXERIL) 10 MG tablet, cyclobenzaprine 10 mg tablet, Disp: , Rfl:    gabapentin (NEURONTIN) 100 MG capsule, Take 1 capsule by mouth at bedtime, Disp: 30 capsule, Rfl: 0   ibuprofen (ADVIL) 800 MG tablet, Take 1 tablet (800 mg total) by mouth 3 (three) times daily., Disp: 42 tablet, Rfl: 0   levocetirizine (XYZAL) 5 MG tablet, TAKE 1 TABLET BY MOUTH IN THE EVENING AS NEEDED **OFFICE VISIT DUE**, Disp: 30 tablet, Rfl: 0   MAGNESIUM PO, Take by mouth. 2 at bedtime, Disp: , Rfl:    Multiple Vitamins-Minerals (ZINC PO),  Take by mouth. 54 mg daily, Disp: , Rfl:    mupirocin nasal ointment (BACTROBAN) 2 %, Place 1 application into the nose 2 (two) times daily. Use one-half of tube in each nostril twice daily for five (5) days. After application, press sides of nose together and gently massage., Disp: 10 g, Rfl: 0   Probiotic Product (PROBIOTIC DAILY PO), Take by mouth. 50 billion daily, Disp: , Rfl:    vitamin E 400 UNIT capsule, Take 400 Units by mouth daily., Disp: , Rfl:    Objective:     Vitals:   11/13/21 1518  BP: 118/82  Pulse: 84  SpO2: 98%  Weight: 233 lb (105.7  kg)  Height: 5\' 4"  (1.626 m)      Body mass index is 39.99 kg/m.    Physical Exam:    Gen: Appears well, nad, nontoxic and pleasant Psych: Alert and oriented, appropriate mood and affect Neuro: sensation intact, strength is 5/5 in upper and lower extremities, muscle tone wnl Skin: no susupicious lesions or rashes  Back - Normal skin, Spine with normal alignment and no deformity.   No tenderness to vertebral process palpation.   Paraspinous muscles are mildly tender in lumbar spine and without spasm Straight leg raise negative, however low back tightness   Electronically signed by:  Melody Lewis D.[facility] 3:36 PM 11/13/21

## 2021-11-14 ENCOUNTER — Encounter: Payer: Self-pay | Admitting: Physical Therapy

## 2021-11-14 ENCOUNTER — Ambulatory Visit: Payer: BC Managed Care – PPO | Attending: Sports Medicine | Admitting: Physical Therapy

## 2021-11-14 DIAGNOSIS — G8929 Other chronic pain: Secondary | ICD-10-CM | POA: Diagnosis present

## 2021-11-14 DIAGNOSIS — M5441 Lumbago with sciatica, right side: Secondary | ICD-10-CM | POA: Diagnosis present

## 2021-11-14 DIAGNOSIS — M25551 Pain in right hip: Secondary | ICD-10-CM | POA: Diagnosis not present

## 2021-11-14 NOTE — Therapy (Signed)
Melody Lewis, [place], [address] Phone: [phone]   Fax:  [phone]  Physical Therapy Treatment  Patient Details  Name: Melody Lewis MRN: [id number] Date of Birth: 10-Dec-1961 Referring Provider (PT): Glennon Mac, DO   Encounter Date: 11/14/2021   PT End of Session - 11/14/21 [place]     Visit Number 5    Number of Visits 16    Date for PT Re-Evaluation 11/30/21    Authorization Type BCBS    PT Start Time 1027    PT Stop Time 1910    PT Time Calculation (min) 40 min             Past Medical History:  Diagnosis Date   ALLERGIC RHINITIS 10/18/2008   Allergy    Arthritis    shoulders bursitis   BACK PAIN, UPPER 10/18/2008   Blood transfusion without reported diagnosis    Dysuria 10/18/2010   GERD (gastroesophageal reflux disease) 06/20/2011   not current   INTERMITTENT VERTIGO 11/01/2009   KNEE PAIN, RIGHT 10/18/2008   LOW BACK PAIN 10/18/2008   LUMBAR RADICULOPATHY, RIGHT 06/28/2009   Seasonal allergies    SHOULDER PAIN, LEFT 10/18/2010   SINUSITIS- ACUTE-NOS 12/22/2009   UNSPECIFIED NEURALGIA NEURITIS AND RADICULITIS 10/18/2008   WRIST PAIN, RIGHT 10/18/2008    Past Surgical History:  Procedure Laterality Date   ABDOMINAL HYSTERECTOMY  05/2005   due to fibroids, still born, ovaries intact   [place]   COLONOSCOPY  08-04-2009   DILATION AND CURETTAGE OF UTERUS  2006   TONSILLECTOMY  1987    There were no vitals filed for this visit.   Subjective Assessment - 11/14/21 1830     Subjective Pt reports continued improvement.  She is very rarely having numbness in her R anterior thigh.  0/10 pain currently.              [place] Adult PT Treatment/Exercise:   Therapeutic Exercise:  NuStep lvl 7 UE/LE x 4 min  Supine sciatic nerve glide 20x RLE Modified thomas stretch with RF - 43'' x2 Prone press up 2x10 Prone quad stretch 2x30" bil Bridge with bil shoulder ext -  black TB S/L clam - blue TB - 2x10 ea Pilates ring squeeze - 2x10 Isometric abdominal contraction with pball - 5'' x10   Manual therapy, concentrating on increasing extensibility of restricted tissue to reduce discomfort and improve mechanics in functional movement:   - CPA lumbar spine, pt in prone, GIII-IV    PT Short Term Goals - 11/14/21 1852       PT SHORT TERM GOAL #1   Title [name] will be >75% HEP compliant to improve carryover between sessions and facilitate independent management of condition    Status Achieved    Target Date 10/26/21               PT Long Term Goals - 10/05/21 1127       PT LONG TERM GOAL #1   Title FOTO goal      PT LONG TERM GOAL #2   Title [name] will report >/= 50% decrease in pain from evaluation  EVAL: 10/10 max pain  target date: 11/29/21      PT LONG TERM GOAL #3   Title [name] will be able to resume yardwork and housework, not limited by pain  EVAL: limited  target date: 11/29/21      PT LONG TERM  GOAL #4   Title [name] will be able to stand for 30 min while making a meal, not limited by pain  EVAL: 5 min  target date: 11/29/21                   Plan - 11/15/21 0839     Clinical Impression Statement Overall, [name] is progressing very well with therapy.  Pt reports no increase in baseline pain following therapy.  Today we concentrated on core strengthening and hip strengthening.  Pt continues to do well with ext based program combined with core strengthening.  Will plan on D/C next visit barring significant change in sxs.  Pt will continue to benefit from skilled physical therapy to address remaining deficits and achieve listed goals.  Continue per POC.    PT Treatment/Interventions ADLs/Self Care Home Management;Aquatic Therapy;Iontophoresis 4mg /ml Dexamethasone;Gait training;Therapeutic activities;Therapeutic exercise;Neuromuscular re-education;Manual techniques;Dry  needling;Vasopneumatic Device;Spinal Manipulations;Joint Manipulations    PT Next Visit Plan assess response to HEP; progress extension-based and posterior strengthening as able    PT Home Exercise Plan LAD at home with husband;  [facility]             Patient will benefit from skilled therapeutic intervention in order to improve the following deficits and impairments:  Abnormal gait, Decreased balance, Decreased endurance, Pain, Decreased strength, Decreased activity tolerance, Decreased range of motion  Visit Diagnosis: Pain in right hip  Chronic right-sided low back pain with right-sided sciatica     Problem List Patient Active Problem List   Diagnosis Date Noted   Infected insect bite of left thigh 03/25/2018   Acute sinus infection 02/24/2016   Radiculitis of left cervical region 02/24/2016   Frozen shoulder syndrome 01/03/2016   Supraclavicular lymphadenopathy 12/26/2015   Nasal sore 12/26/2015   Right shoulder pain 03/17/2015   Cystitis 04/20/2014   Urinary frequency 03/03/2013   Stress incontinence in female 03/03/2013   Vaginal dryness, menopausal 03/03/2013   GERD (gastroesophageal reflux disease) 06/20/2011   Preventative health care 05/04/2011   LUMBAR RADICULOPATHY, RIGHT 06/28/2009   Allergic rhinitis 10/18/2008   LOW BACK PAIN 10/18/2008   BACK PAIN, UPPER 10/18/2008    Melody Lewis, PT 11/15/2021, 8:39 AM  [facility] [address] [place], [place], [address] Phone: [phone]   Fax:  [phone]  Name: Melody Lewis MRN: [id number] Date of Birth: 09-26-1961

## 2021-11-14 NOTE — Patient Instructions (Signed)
Access Code: [email or web address] URL: [email or web address] Date: 11/14/2021 Prepared by: Shearon Balo  Exercises Seated Sciatic Tensioner - 1 x daily - 7 x weekly - 2 sets - 15 reps Static Prone on Elbows - 1 x daily - 7 x weekly - 2 reps - 60 sec hold Supine Bridge - 1 x daily - 7 x weekly - 2 sets - 10 reps - 3 sec hold Clamshell with Resistance - 1 x daily - 7 x weekly - 3 sets - 10 reps

## 2021-11-16 ENCOUNTER — Encounter: Payer: BC Managed Care – PPO | Admitting: Physical Therapy

## 2021-11-21 ENCOUNTER — Encounter: Payer: Self-pay | Admitting: Physical Therapy

## 2021-11-21 ENCOUNTER — Other Ambulatory Visit: Payer: Self-pay

## 2021-11-21 ENCOUNTER — Ambulatory Visit: Payer: BC Managed Care – PPO | Admitting: Physical Therapy

## 2021-11-21 DIAGNOSIS — G8929 Other chronic pain: Secondary | ICD-10-CM

## 2021-11-21 DIAGNOSIS — M25551 Pain in right hip: Secondary | ICD-10-CM | POA: Diagnosis not present

## 2021-11-21 NOTE — Therapy (Signed)
[place], [place], [address] Phone: [phone]   Fax:  [phone]  PHYSICAL THERAPY DISCHARGE SUMMARY  Visits from Start of Care: 6  Current functional level related to goals / functional outcomes: See assessment/goals   Remaining deficits: See assessment/goals   Education / Equipment: HEP and D/C plans  Patient agrees to discharge. Patient goals were met. Patient is being discharged due to meeting the stated rehab goals.  Patient Details  Name: Melody Lewis MRN: [id number] Date of Birth: 12-Jul-1961 Referring Provider (PT): Glennon Mac, DO   Encounter Date: 11/21/2021   PT End of Session - 11/21/21 1837     Visit Number 6    Number of Visits 16    Date for PT Re-Evaluation 11/30/21    Authorization Type BCBS    PT Start Time 1751    PT Stop Time 1905    PT Time Calculation (min) 30 min             Past Medical History:  Diagnosis Date   ALLERGIC RHINITIS 10/18/2008   Allergy    Arthritis    shoulders bursitis   BACK PAIN, UPPER 10/18/2008   Blood transfusion without reported diagnosis    Dysuria 10/18/2010   GERD (gastroesophageal reflux disease) 06/20/2011   not current   INTERMITTENT VERTIGO 11/01/2009   KNEE PAIN, RIGHT 10/18/2008   LOW BACK PAIN 10/18/2008   LUMBAR RADICULOPATHY, RIGHT 06/28/2009   Seasonal allergies    SHOULDER PAIN, LEFT 10/18/2010   SINUSITIS- ACUTE-NOS 12/22/2009   UNSPECIFIED NEURALGIA NEURITIS AND RADICULITIS 10/18/2008   WRIST PAIN, RIGHT 10/18/2008    Past Surgical History:  Procedure Laterality Date   ABDOMINAL HYSTERECTOMY  05/2005   due to fibroids, still born, ovaries intact   [place]   COLONOSCOPY  08-04-2009   DILATION AND CURETTAGE OF UTERUS  2006   TONSILLECTOMY  1987    There were no vitals filed for this visit.    Objective:  FOTO: 72   [facility] Adult PT Treatment/Exercise:   Therapeutic Exercise:  NuStep lvl  7 UE/LE x 4 min  Supine sciatic nerve glide 20x RLE Bridge with bil shoulder ext - black TB - 3x10 S/L clam - black TB - 2x10 ea Pilates ring squeeze - 2x10 Isometric abdominal contraction with pball - 5'' x10  Therapeutic Activity - collecting information for goals, checking progress, and reviewing with patient        PT Short Term Goals - 11/14/21 1852       PT SHORT TERM GOAL #1   Title Melody Lewis will be >75% HEP compliant to improve carryover between sessions and facilitate independent management of condition    Status Achieved    Target Date 10/26/21               PT Long Term Goals - 11/21/21 1840       PT LONG TERM GOAL #1   Title FOTO 52 -> 61    Baseline 12/13:  72    Status Achieved      PT LONG TERM GOAL #2   Title Melody Lewis will report >/= 50% decrease in pain from evaluation    EVAL: 10/10 max pain    target date: 11/29/21    Baseline 12/13: 0/10    Status Achieved      PT LONG TERM GOAL #3   Title Melody Lewis will be able to resume yardwork  and housework, not limited by pain    EVAL: limited    target date: 11/29/21    Baseline 12/31: able to resume light work    Status Achieved      PT [place] #4   Title Melody Lewis will be able to stand for 30 min while making a meal, not limited by pain    EVAL: 5 min    target date: 11/29/21    Baseline 12/13: 30 min    Status Achieved                   Plan - 11/21/21 1846     Clinical Impression Statement Melody Lewis has progressed very well with therapy.  Improved impairments include: core, pain, and LE strength.  Functional improvements include: transfers, ambulation, and standing tolerance.  Progressions needed include: continue ext based program at home.  Barriers to progress include: none.  Please see baseline and/or status section in "Goals" for specific progress on short term and long term goals established at evaluation.  I recommend D/C home with HEP; pt agrees  with plan.    PT Treatment/Interventions ADLs/Self Care Home Management;Aquatic Therapy;Iontophoresis 51m/ml Dexamethasone;Gait training;Therapeutic activities;Therapeutic exercise;Neuromuscular re-education;Manual techniques;Dry needling;Vasopneumatic Device;Spinal Manipulations;Joint Manipulations    PT Next Visit Plan assess response to HEP; progress extension-based and posterior strengthening as able    PT Home [facility]            Patient will benefit from skilled therapeutic intervention in order to improve the following deficits and impairments:  Abnormal gait, Decreased balance, Decreased endurance, Pain, Decreased strength, Decreased activity tolerance, Decreased range of motion  Visit Diagnosis: Chronic right-sided low back pain with right-sided sciatica  Pain in right hip     Problem List Patient Active Problem List   Diagnosis Date Noted   Infected insect bite of left thigh 03/25/2018   Acute sinus infection 02/24/2016   Radiculitis of left cervical region 02/24/2016   Frozen shoulder syndrome 01/03/2016   Supraclavicular lymphadenopathy 12/26/2015   Nasal sore 12/26/2015   Right shoulder pain 03/17/2015   Cystitis 04/20/2014   Urinary frequency 03/03/2013   Stress incontinence in female 03/03/2013   Vaginal dryness, menopausal 03/03/2013   GERD (gastroesophageal reflux disease) 06/20/2011   Preventative health care 05/04/2011   LUMBAR RADICULOPATHY, RIGHT 06/28/2009   Allergic rhinitis 10/18/2008   LOW BACK PAIN 10/18/2008   BACK PAIN, UPPER 10/18/2008    Melody Lewis PT 11/21/2021, 7:07 PM  [facility]GSanta [place] [address]Phone: [phone]  Fax:  [phone] Name: Melody GOLDENSTEINMRN: [id number] of Birth: [birth year]

## 2021-11-23 ENCOUNTER — Encounter: Payer: BC Managed Care – PPO | Admitting: Physical Therapy

## 2022-04-26 ENCOUNTER — Other Ambulatory Visit (HOSPITAL_BASED_OUTPATIENT_CLINIC_OR_DEPARTMENT_OTHER): Payer: Self-pay | Admitting: Internal Medicine

## 2022-04-26 ENCOUNTER — Other Ambulatory Visit: Payer: Self-pay | Admitting: Internal Medicine

## 2022-04-26 ENCOUNTER — Other Ambulatory Visit (HOSPITAL_COMMUNITY): Payer: Self-pay | Admitting: Internal Medicine

## 2022-04-26 DIAGNOSIS — E785 Hyperlipidemia, unspecified: Secondary | ICD-10-CM

## 2022-05-04 ENCOUNTER — Ambulatory Visit (HOSPITAL_BASED_OUTPATIENT_CLINIC_OR_DEPARTMENT_OTHER)
Admission: RE | Admit: 2022-05-04 | Discharge: 2022-05-04 | Disposition: A | Discharge status: Home / Self Care | Payer: Self-pay | Source: Ambulatory Visit | Attending: Internal Medicine | Admitting: Internal Medicine

## 2022-05-04 DIAGNOSIS — E785 Hyperlipidemia, unspecified: Secondary | ICD-10-CM | POA: Insufficient documentation

## 2023-01-03 ENCOUNTER — Other Ambulatory Visit: Payer: Self-pay | Admitting: Internal Medicine

## 2023-09-20 ENCOUNTER — Ambulatory Visit
Admission: RE | Admit: 2023-09-20 | Discharge: 2023-09-20 | Disposition: A | Discharge status: Home / Self Care | Payer: BC Managed Care – PPO | Source: Ambulatory Visit | Attending: Internal Medicine | Admitting: Internal Medicine

## 2023-09-20 ENCOUNTER — Other Ambulatory Visit: Payer: Self-pay | Admitting: Internal Medicine

## 2023-09-20 DIAGNOSIS — Z0000 Encounter for general adult medical examination without abnormal findings: Secondary | ICD-10-CM

## 2023-11-26 ENCOUNTER — Other Ambulatory Visit: Payer: Self-pay

## 2023-11-26 ENCOUNTER — Ambulatory Visit (INDEPENDENT_AMBULATORY_CARE_PROVIDER_SITE_OTHER): Payer: BC Managed Care – PPO

## 2023-11-26 ENCOUNTER — Ambulatory Visit: Payer: BC Managed Care – PPO | Admitting: Family Medicine

## 2023-11-26 ENCOUNTER — Encounter: Payer: Self-pay | Admitting: Family Medicine

## 2023-11-26 VITALS — BP 128/76 | HR 94 | Ht 64.0 in | Wt 238.0 lb

## 2023-11-26 DIAGNOSIS — M79671 Pain in right foot: Secondary | ICD-10-CM | POA: Diagnosis not present

## 2023-11-26 DIAGNOSIS — K219 Gastro-esophageal reflux disease without esophagitis: Secondary | ICD-10-CM | POA: Diagnosis not present

## 2023-11-26 NOTE — Progress Notes (Signed)
   I, Stevenson Clinch, CMA acting as a scribe for Clementeen Graham, MD.  Melody Lewis is a 62 y.o. female who presents to [facility] at [facility] today for R foot pain. Pt was previously seen in 2021-22 for L ankle and low back pain.  Today, pt c/o R foot pain ongoing since Saturday. WUJ:WJXBJY stepped while working in the yard, inversion injury. Denies pop at time of injury. Some swelling pres Pt locates pain to the bottom lateral aspect of the foot.   Swelling: yes Treatments tried: ice, soaking, Mg Sport, walking boot, IBU, Tylenol  She is traveling to [place] on Saturday for a girls weekend.  She will be doing a lot of walking.  Pertinent review of systems: No fevers or chills  Relevant historical information: GERD.  Prescribed ibuprofen for pain   Exam:  BP 128/76   Pulse 94   Ht 5\' 4"  (1.626 m)   Wt 238 lb (108 kg)   SpO2 97%   BMI 40.85 kg/m  General: Well Developed, well nourished, and in no acute distress.   MSK: Right foot and ankle swelling at lateral midfoot and lateral ankle. Tender palpation lateral midfoot especially around the proximal fifth metatarsal. Normal foot and ankle motion. Intact strength. Stable ligamentous exam.    Lab and Radiology Results  Diagnostic Limited MSK Ultrasound of: Right lateral foot and ankle Normal appearance of the proximal fifth metatarsal.  No visible tear of the peroneal tendons. Impression: No fracture or peroneal tendon tear   X-ray images right foot obtained today personally and independently interpreted. No acute fractures are visible. Await formal radiology review  Assessment and Plan: 62 y.o. female with right lateral foot pain after an inversion injury.  Patient suffered a sprain or strain of the midfoot and ankle.  Plan for conservative management home exercise program and watchful waiting.  Okay to continue ibuprofen for now.  Recommend adding Tylenol arthritis to ibuprofen and using ibuprofen  as little as possible.  She does have GERD and is at risk for worsening stomach irritation.  She is traveling to [place] on Saturday.  Today is Tuesday.  Recommend purchasing a short cam walker boot to use during this trip as needed.  She is can to be doing a lot more walking especially in the airport and out and about.  A cam walker boot will allow her to move more comfortably.  If not improving after the next week or 2 recommend physical therapy.  She will contact my office if needed.   PDMP not reviewed this encounter. Orders Placed This Encounter  Procedures   DG Foot Complete Right    Standing Status:   Future    Number of Occurrences:   1    Expiration Date:   11/25/2024    Reason for Exam (SYMPTOM  OR DIAGNOSIS REQUIRED):   right foot pain / injury    Preferred imaging location?:   [facility]   [place] LIMITED JOINT SPACE STRUCTURES LOW RIGHT(NO LINKED CHARGES)    Reason for Exam (SYMPTOM  OR DIAGNOSIS REQUIRED):   right foot pain    Preferred imaging location?:   [facility]   No orders of the defined types were placed in this encounter.    Discussed warning signs or symptoms. Please see discharge instructions. Patient expresses understanding.   The above documentation has been reviewed and is accurate and complete Clementeen Graham, M.D.

## 2023-11-26 NOTE — Patient Instructions (Addendum)
Thank you for coming in today.   You can use tylenol arthritis with ibuprofen.   Please go to [facility] supply to get the cam walker boot we talked about today. You may also be able to get it from [employer].    If not better we can do physical therapy. Let me know.   Let the airline or airport know especially in [place] you will need transport to your gait.

## 2024-09-25 ENCOUNTER — Other Ambulatory Visit: Payer: Self-pay | Admitting: Internal Medicine

## 2024-09-25 DIAGNOSIS — Z1231 Encounter for screening mammogram for malignant neoplasm of breast: Secondary | ICD-10-CM

## 2024-10-15 ENCOUNTER — Ambulatory Visit
Admission: RE | Admit: 2024-10-15 | Discharge: 2024-10-15 | Disposition: A | Discharge status: Home / Self Care | Source: Ambulatory Visit | Attending: Internal Medicine | Admitting: Internal Medicine

## 2024-10-15 DIAGNOSIS — Z1231 Encounter for screening mammogram for malignant neoplasm of breast: Secondary | ICD-10-CM

## 2024-11-24 ENCOUNTER — Encounter: Payer: Self-pay | Admitting: Internal Medicine

## 2024-11-24 ENCOUNTER — Ambulatory Visit

## 2024-11-24 VITALS — Ht 64.0 in | Wt 242.0 lb

## 2024-11-24 DIAGNOSIS — Z800 Family history of malignant neoplasm of digestive organs: Secondary | ICD-10-CM

## 2024-11-24 DIAGNOSIS — Z860100 Personal history of colon polyps, unspecified: Secondary | ICD-10-CM

## 2024-11-24 MED ORDER — NA SULFATE-K SULFATE-MG SULF 17.5-3.13-1.6 GM/177ML PO SOLN: 1.0000 | Freq: Once | ORAL | 0 refills | Status: AC

## 2024-11-24 NOTE — Progress Notes (Signed)

## 2024-12-07 ENCOUNTER — Telehealth: Payer: Self-pay | Admitting: Internal Medicine

## 2024-12-07 NOTE — Telephone Encounter (Signed)
 Inbound call from patient stating that she is having a colonoscopy procedure tomorrow and was wondering if she needed to reschedule due to her last week having sinus issues causing her to get some cold sore. Patient is requesting a call back. Please advise.

## 2024-12-07 NOTE — Telephone Encounter (Signed)
 Returned call, patient states she was dealing with some sinus issues last week and now she has a cold sore on her lip  asking if it is still okay to have her colonoscopy tomorrow. Denies any fever, SOB, or coughing so RN instructed patient to proceed with colonoscopy tomorrow as scheduled and she verbalized understanding. SChaplin, RN,BSN

## 2024-12-08 ENCOUNTER — Encounter: Payer: Self-pay | Admitting: Internal Medicine

## 2024-12-08 ENCOUNTER — Ambulatory Visit (AMBULATORY_SURGERY_CENTER): Admitting: Internal Medicine

## 2024-12-08 VITALS — BP 122/78 | HR 70 | Temp 97.3°F | Resp 13 | Ht 64.0 in | Wt 242.0 lb

## 2024-12-08 DIAGNOSIS — Z1211 Encounter for screening for malignant neoplasm of colon: Secondary | ICD-10-CM

## 2024-12-08 DIAGNOSIS — Z860101 Personal history of adenomatous and serrated colon polyps: Secondary | ICD-10-CM | POA: Diagnosis not present

## 2024-12-08 DIAGNOSIS — Z860100 Personal history of colon polyps, unspecified: Secondary | ICD-10-CM

## 2024-12-08 DIAGNOSIS — Z800 Family history of malignant neoplasm of digestive organs: Secondary | ICD-10-CM | POA: Diagnosis not present

## 2024-12-08 DIAGNOSIS — K5730 Diverticulosis of large intestine without perforation or abscess without bleeding: Secondary | ICD-10-CM

## 2024-12-08 MED ORDER — SODIUM CHLORIDE 0.9 % IV SOLN: 500.0000 mL | Freq: Once | INTRAVENOUS | Status: DC

## 2024-12-08 NOTE — Progress Notes (Signed)
 Transferred to PACU via stretcher. Patient arousing to stimulation.  VSS upon leaving procedure room.

## 2024-12-08 NOTE — Op Note (Signed)
 [facility] Patient Name: Margerie Fraiser Procedure Date: 12/08/2024 1:14 PM MRN: [id number] Endoscopist: Norleen SAILOR. Abran , MD, [id number] Age: 63 Referring MD:  Date of Birth: 01/18/1961 Gender: Female Account #: [id number] Procedure:                Colonoscopy Indications:              High risk colon cancer surveillance: Personal                            history of non-advanced adenoma. Family history of                            colon cancer in father around age 9. Prior                            examinations performed 2010, 2015, 2020. Medicines:                Monitored Anesthesia Care Procedure:                Pre-Anesthesia Assessment:                           - Prior to the procedure, a History and Physical                            was performed, and patient medications and                            allergies were reviewed. The patient's tolerance of                            previous anesthesia was also reviewed. The risks                            and benefits of the procedure and the sedation                            options and risks were discussed with the patient.                            All questions were answered, and informed consent                            was obtained. Prior Anticoagulants: The patient has                            taken no anticoagulant or antiplatelet agents. ASA                            Grade Assessment: II - A patient with mild systemic                            disease. After reviewing the risks and benefits,  the patient was deemed in satisfactory condition to                            undergo the procedure.                           After obtaining informed consent, the colonoscope                            was passed under direct vision. Throughout the                            procedure, the patient's blood pressure, pulse, and                            oxygen saturations were  monitored continuously. The                            CF [id number] #[id number] was introduced through the anus                            and advanced to the the cecum, identified by                            appendiceal orifice and ileocecal valve. The                            ileocecal valve, appendiceal orifice, and rectum                            were photographed. The quality of the bowel                            preparation was excellent. The colonoscopy was                            performed without difficulty. The patient tolerated                            the procedure well. The bowel preparation used was                            SUPREP via split dose instruction. Scope In: 1:25:33 PM Scope Out: 1:43:38 PM Scope Withdrawal Time: 0 hours 5 minutes 58 seconds  Total Procedure Duration: 0 hours 18 minutes 5 seconds  Findings:                 Diverticula were found [Site].                           The entire examined colon appeared normal on direct                            and retroflexion views. Complications:  No immediate complications. Estimated blood loss:                            None. Estimated Blood Loss:     Estimated blood loss: none. Impression:               - Sigmoid diverticulosis                           - The entire examined colon is otherwise normal on                            direct and retroflexion views.                           - No specimens collected. Recommendation:           - Repeat colonoscopy in 5 years for surveillance.                           - Patient has a contact number available for                            emergencies. The signs and symptoms of potential                            delayed complications were discussed with the                            patient. Return to normal activities tomorrow.                            Written discharge instructions were provided to the                            patient.                            - Resume previous diet.                           - Continue present medications. Norleen SAILOR. Abran, MD 12/08/2024 2:01:35 PM This report has been signed electronically.

## 2024-12-08 NOTE — Patient Instructions (Addendum)
" °  Handout on diverticulosis given to you    YOU HAD AN ENDOSCOPIC PROCEDURE TODAY AT THE [facility]:   Refer to the procedure report that was given to you for any specific questions about what was found during the examination.  If the procedure report does not answer your questions, please call your gastroenterologist to clarify.  If you requested that your care partner not be given the details of your procedure findings, then the procedure report has been included in a sealed envelope for you to review at your convenience later.  YOU SHOULD EXPECT: Some feelings of bloating in the abdomen. Passage of more gas than usual.  Walking can help get rid of the air that was put into your GI tract during the procedure and reduce the bloating. If you had a lower endoscopy (such as a colonoscopy or flexible sigmoidoscopy) you may notice spotting of blood in your stool or on the toilet paper. If you underwent a bowel prep for your procedure, you may not have a normal bowel movement for a few days.  Please Note:  You might notice some irritation and congestion in your nose or some drainage.  This is from the oxygen used during your procedure.  There is no need for concern and it should clear up in a day or so.  SYMPTOMS TO REPORT IMMEDIATELY:  Following lower endoscopy (colonoscopy or flexible sigmoidoscopy):  Excessive amounts of blood in the stool  Significant tenderness or worsening of abdominal pains  Swelling of the abdomen that is new, acute  Fever of 100F or higher   For urgent or emergent issues, a gastroenterologist can be reached at any hour by calling [phone]. Do not use MyChart messaging for urgent concerns.    DIET:  We do recommend a small meal at first, but then you may proceed to your regular diet.  Drink plenty of fluids but you should avoid alcoholic beverages for 24 hours.  ACTIVITY:  You should plan to take it easy for the rest of today and you should NOT DRIVE  or use heavy machinery until tomorrow (because of the sedation medicines used during the test).    FOLLOW UP: Our staff will call the number listed on your records the next business day following your procedure.  We will call around 7:15- 8:00 am to check on you and address any questions or concerns that you may have regarding the information given to you following your procedure. If we do not reach you, we will leave a message.     If any biopsies were taken you will be contacted by phone or by letter within the next 1-3 weeks.  Please call us  at [phone] if you have not heard about the biopsies in 3 weeks.    SIGNATURES/CONFIDENTIALITY: You and/or your care partner have signed paperwork which will be entered into your electronic medical record.  These signatures attest to the fact that that the information above on your After Visit Summary has been reviewed and is understood.  Full responsibility of the confidentiality of this discharge information lies with you and/or your care-partner. "

## 2024-12-08 NOTE — Progress Notes (Signed)
Pt. states no medical or surgical changes since previsit or office visit. 

## 2024-12-08 NOTE — Progress Notes (Signed)
 HISTORY OF PRESENT ILLNESS:  Melody Lewis is a 63 y.o. female presents to the for surveillance colonoscopy due to personal history of adenomatous colon polyps and family history of colon cancer.  REVIEW OF SYSTEMS:  All non-GI ROS negative except for  Past Medical History:  Diagnosis Date   ALLERGIC RHINITIS 10/18/2008   Allergy    Arthritis    shoulders bursitis   BACK PAIN, UPPER 10/18/2008   Blood transfusion without reported diagnosis    Dysuria 10/18/2010   GERD (gastroesophageal reflux disease) 06/20/2011   not current   INTERMITTENT VERTIGO 11/01/2009   KNEE PAIN, RIGHT 10/18/2008   LOW BACK PAIN 10/18/2008   LUMBAR RADICULOPATHY, RIGHT 06/28/2009   Seasonal allergies    SHOULDER PAIN, LEFT 10/18/2010   SINUSITIS- ACUTE-NOS 12/22/2009   UNSPECIFIED NEURALGIA NEURITIS AND RADICULITIS 10/18/2008   WRIST PAIN, RIGHT 10/18/2008    Past Surgical History:  Procedure Laterality Date   ABDOMINAL HYSTERECTOMY  05/2005   due to fibroids, still born, ovaries intact   CESAREAN SECTION  1983   COLONOSCOPY  08-04-2009   DILATION AND CURETTAGE OF UTERUS  2006   TONSILLECTOMY  1987    Social History Melody Lewis  reports that she has never smoked. She has never used smokeless tobacco. She reports that she does not drink alcohol and does not use drugs.  family history includes Arthritis in her father and mother; Colon cancer in her father and maternal aunt; Dementia in her father; Diabetes in her father; Hypertension in her brother, daughter, and mother; Kidney disease in her mother; Parkinsonism in her father.  Allergies[1]     PHYSICAL EXAMINATION: Vital signs: BP (!) 140/64   Pulse 83   Temp (!) 97.3 F (36.3 C) (Skin)   Ht 5' 4 (1.626 m)   Wt 242 lb (109.8 kg)   SpO2 97%   BMI 41.54 kg/m  General: Well-developed, well-nourished, no acute distress HEENT: Sclerae are anicteric, conjunctiva pink. Oral mucosa intact Lungs: Clear Heart: Regular Abdomen: soft,  nontender, nondistended, no obvious ascites, no peritoneal signs, normal bowel sounds. No organomegaly. Extremities: No edema Psychiatric: alert and oriented x3. Cooperative     ASSESSMENT:  Personal history of adenomatous colon polyps and family history of colon cancer   PLAN: Surveillance colonoscopy          [1]  Allergies Allergen Reactions   Sulfamethoxazole-Trimethoprim Other (See Comments)    REACTION: Faint   Sulfa Antibiotics Other (See Comments)    Makes me feel like I'm going to pass out

## 2024-12-11 ENCOUNTER — Telehealth: Payer: Self-pay

## 2024-12-11 NOTE — Telephone Encounter (Signed)
" °  Follow up Call-     12/08/2024   12:39 PM  Call back number  Post procedure Call Back phone  # [phone]  Permission to leave phone message Yes     Patient questions:  Do you have a fever, pain , or abdominal swelling? No. Pain Score  0 *  Have you tolerated food without any problems? Yes.    Have you been able to return to your normal activities? Yes.    Do you have any questions about your discharge instructions: Diet   No. Medications  No. Follow up visit  No.  Do you have questions or concerns about your Care? No.  Actions: * If pain score is 4 or above: No action needed, pain <4.   "
